# Patient Record
Sex: Female | Born: 1985 | Race: White | Hispanic: Yes | Marital: Married | Smoking: Never smoker
Health system: Southern US, Community
[De-identification: ages and names within clinical notes are randomized; demographics above are authoritative.]

## PROBLEM LIST (undated history)

## (undated) DIAGNOSIS — J45909 Unspecified asthma, uncomplicated: Secondary | ICD-10-CM

## (undated) DIAGNOSIS — G43909 Migraine, unspecified, not intractable, without status migrainosus: Secondary | ICD-10-CM

## (undated) DIAGNOSIS — T7840XA Allergy, unspecified, initial encounter: Secondary | ICD-10-CM

## (undated) HISTORY — DX: Unspecified asthma, uncomplicated: J45.909

## (undated) HISTORY — DX: Allergy, unspecified, initial encounter: T78.40XA

---

## 2004-12-28 ENCOUNTER — Inpatient Hospital Stay (HOSPITAL_COMMUNITY): Admission: AD | Admit: 2004-12-28 | Discharge: 2004-12-28 | Payer: Self-pay | Admitting: Obstetrics & Gynecology

## 2005-03-17 ENCOUNTER — Inpatient Hospital Stay (HOSPITAL_COMMUNITY): Admission: AD | Admit: 2005-03-17 | Discharge: 2005-03-17 | Payer: Self-pay | Admitting: Obstetrics & Gynecology

## 2005-03-25 ENCOUNTER — Inpatient Hospital Stay (HOSPITAL_COMMUNITY): Admission: AD | Admit: 2005-03-25 | Discharge: 2005-03-27 | Payer: Self-pay | Admitting: Obstetrics & Gynecology

## 2005-03-26 ENCOUNTER — Ambulatory Visit: Payer: Self-pay | Admitting: Pediatrics

## 2007-08-25 ENCOUNTER — Emergency Department (HOSPITAL_COMMUNITY): Admission: EM | Admit: 2007-08-25 | Discharge: 2007-08-25 | Payer: Self-pay | Admitting: Emergency Medicine

## 2009-01-14 ENCOUNTER — Emergency Department (HOSPITAL_COMMUNITY): Admission: EM | Admit: 2009-01-14 | Discharge: 2009-01-15 | Payer: Self-pay | Admitting: Emergency Medicine

## 2011-02-07 ENCOUNTER — Ambulatory Visit: Payer: Worker's Compensation

## 2011-02-16 ENCOUNTER — Ambulatory Visit: Payer: Self-pay

## 2011-02-16 DIAGNOSIS — J111 Influenza due to unidentified influenza virus with other respiratory manifestations: Secondary | ICD-10-CM

## 2012-06-17 ENCOUNTER — Ambulatory Visit: Payer: Self-pay | Admitting: Physician Assistant

## 2012-06-17 VITALS — BP 116/77 | HR 69 | Temp 98.0°F | Resp 16 | Ht 64.0 in | Wt 157.2 lb

## 2012-06-17 DIAGNOSIS — J309 Allergic rhinitis, unspecified: Secondary | ICD-10-CM

## 2012-06-17 DIAGNOSIS — R059 Cough, unspecified: Secondary | ICD-10-CM

## 2012-06-17 DIAGNOSIS — R05 Cough: Secondary | ICD-10-CM

## 2012-06-17 DIAGNOSIS — J45909 Unspecified asthma, uncomplicated: Secondary | ICD-10-CM

## 2012-06-17 MED ORDER — ALBUTEROL SULFATE HFA 108 (90 BASE) MCG/ACT IN AERS: 2.0000 | INHALATION_SPRAY | RESPIRATORY_TRACT | Status: DC | PRN

## 2012-06-17 MED ORDER — FLUTICASONE PROPIONATE 50 MCG/ACT NA SUSP: 2.0000 | Freq: Every day | NASAL | Status: DC

## 2012-06-17 MED ORDER — PREDNISONE 20 MG PO TABS: ORAL_TABLET | ORAL | Status: DC

## 2012-06-17 NOTE — Progress Notes (Signed)
  Subjective:    Patient ID: Jill Ramirez, female    DOB: 1986-02-16, 27 y.o.   MRN: [id number]  HPI This 27 y.o. female presents for evaluation of several weeks of "normal" allergties.  This past week it got much worse.  Unable to sleep.  Sore throat.  Usually worst first thing in the morning and at night.  Lots of phlegm in the throat.  Cough is non-productive.  Nasal drainage is clear, with some bloody tinge.The other night felt chest pressure associated with her symptoms.  Review of Systems As above. No HA, dizziness, vision change, N/V, diarrhea, constipation, dysuria, urinary urgency or frequency, myalgias, arthralgias or rash.     Objective:   Physical Exam Blood pressure 116/77, pulse 69, temperature 98 F (36.7 C), temperature source Oral, resp. rate 16, height 5\' 4"  (1.626 m), weight 157 lb 3.2 oz (71.305 kg), SpO2 99.00%. Body mass index is 26.97 kg/(m^2). Well-developed, well nourished female who is awake, alert and oriented, in NAD. HEENT: [place]/AT, PERRL, EOMI.  Sclera and conjunctiva are clear.  EAC are patent, TMs are normal in appearance. Nasal mucosa is pale pink and moist. OP is clear. Neck: supple, non-tender, no lymphadenopathy, thyromegaly. Heart: RRR, no murmur Lungs: normal effort, CTA Extremities: no cyanosis, clubbing or edema. Skin: warm and dry without rash. Psychologic: good mood and appropriate affect, normal speech and behavior.        Assessment & Plan:  Allergic rhinitis - Plan: fluticasone (FLONASE) 50 MCG/ACT nasal spray, predniSONE (DELTASONE) 20 MG tablet  Asthma - Plan: predniSONE (DELTASONE) 20 MG tablet, albuterol (PROVENTIL HFA;VENTOLIN HFA) 108 (90 BASE) MCG/ACT inhaler  Cough - Plan: predniSONE (DELTASONE) 20 MG tablet  Return in 1 month, sooner if worse.  Fernande Bras, PA-C Physician Assistant-Certified [facility]

## 2012-06-17 NOTE — Patient Instructions (Signed)
After spending time outdoors, bathe and change clothes to remove the allergens from your skin, hair and clothing. Drink at least 64 ounces of water each day. Once your symptoms are well controlled, you can stop the Zyrtec (or Claritin or ALlegra) and use it only as needed, but stay on the Flonase. Once you are well, f you are using the albuterol inhaler more than 3 nights/month, or more than 3 times/week, please return for additional evaluation.

## 2012-06-18 ENCOUNTER — Encounter: Payer: Self-pay | Admitting: Obstetrics & Gynecology

## 2012-06-18 ENCOUNTER — Ambulatory Visit (INDEPENDENT_AMBULATORY_CARE_PROVIDER_SITE_OTHER): Payer: Self-pay | Admitting: Obstetrics & Gynecology

## 2012-06-18 VITALS — BP 115/79 | HR 62 | Temp 98.7°F | Ht 63.0 in | Wt 158.0 lb

## 2012-06-18 DIAGNOSIS — R32 Unspecified urinary incontinence: Secondary | ICD-10-CM

## 2012-06-18 DIAGNOSIS — Z30431 Encounter for routine checking of intrauterine contraceptive device: Secondary | ICD-10-CM

## 2012-06-18 DIAGNOSIS — Z3202 Encounter for pregnancy test, result negative: Secondary | ICD-10-CM

## 2012-06-18 DIAGNOSIS — Z01419 Encounter for gynecological examination (general) (routine) without abnormal findings: Secondary | ICD-10-CM

## 2012-06-18 LAB — POCT URINALYSIS DIPSTICK
Bilirubin, UA: NEGATIVE
Glucose, UA: NEGATIVE
Ketones, UA: NEGATIVE
Nitrite, UA: NEGATIVE
Protein, UA: NEGATIVE
Spec Grav, UA: 1.02
Urobilinogen, UA: NEGATIVE
pH, UA: 5

## 2012-06-18 LAB — HCG, SERUM, QUALITATIVE: Preg, Serum: NEGATIVE

## 2012-06-18 LAB — POCT URINE PREGNANCY: Preg Test, Ur: NEGATIVE

## 2012-06-18 NOTE — Progress Notes (Signed)
.   Subjective:     Jill Ramirez is a 27 y.o. female here for a routine exam.  Current complaints: pt had her Mirena inserted 05/2006 and would like to leave it in for one more year.  Pt states that she is feels pregnant - breast tenderness, weight gain and unable to hold her urine while sneezing or laughing.  She would like a pregnancy test and her urine checked.  Personal health questionnaire reviewed: no.   Gynecologic History No LMP recorded. Patient is not currently having periods (Reason: IUD). Contraception: IUD Last Pap: 11/2010. Results were: normal Last mammogram: N/A  Obstetric History OB History   Grav Para Term Preterm Abortions TAB SAB Ect Mult Living                   The following portions of the patient's history were reviewed and updated as appropriate: allergies, current medications, past family history, past medical history, past social history, past surgical history and problem list.  Review of Systems Pertinent items are noted in HPI.    Objective:    General appearance: alert Breasts: normal appearance, no masses or tenderness Abdomen: soft, non-tender; bowel sounds normal; no masses,  no organomegaly Pelvic: cervix normal in appearance, IUD strings present; external genitalia normal, no adnexal masses or tenderness, uterus normal size, shape, and consistency and vagina normal without discharge    Assessment:    Healthy female exam.    Plan:    Return prn

## 2012-06-18 NOTE — Patient Instructions (Signed)
Calorie Counting Diet A calorie counting diet requires you to eat the number of calories that are right for you in a day. Calories are the measurement of how much energy you get from the food you eat. Eating the right amount of calories is important for staying at a healthy weight. If you eat too many calories, your body will store them as fat and you may gain weight. If you eat too few calories, you may lose weight. Counting the number of calories you eat during a day will help you know if you are eating the right amount. A Registered Dietitian can determine how many calories you need in a day. The amount of calories needed varies from person to person. If your goal is to lose weight, you will need to eat fewer calories. Losing weight can benefit you if you are overweight or have health problems such as heart disease, high blood pressure, or diabetes. If your goal is to gain weight, you will need to eat more calories. Gaining weight may be necessary if you have a certain health problem that causes your body to need more energy. TIPS Whether you are increasing or decreasing the number of calories you eat during a day, it may be hard to get used to changes in what you eat and drink. The following are tips to help you keep track of the number of calories you eat.  Measure foods at home with measuring cups. This helps you know the amount of food and number of calories you are eating.  Restaurants often serve food in amounts that are larger than 1 serving. While eating out, estimate how many servings of a food you are given. For example, a serving of cooked rice is  cup or about the size of half of a fist. Knowing serving sizes will help you be aware of how much food you are eating at restaurants.  Ask for smaller portion sizes or child-size portions at restaurants.  Plan to eat half of a meal at a restaurant. Take the rest home or share the other half with a friend.  Read the Nutrition Facts panel on  food labels for calorie content and serving size. You can find out how many servings are in a package, the size of a serving, and the number of calories each serving has.  For example, a package might contain 3 cookies. The Nutrition Facts panel on that package says that 1 serving is 1 cookie. Below that, it will say there are 3 servings in the container. The calories section of the Nutrition Facts label says there are 90 calories. This means there are 90 calories in 1 cookie (1 serving). If you eat 1 cookie you have eaten 90 calories. If you eat all 3 cookies, you have eaten 270 calories (3 servings x 90 calories = 270 calories). The list below tells you how big or small some common portion sizes are.  1 oz.........4 stacked dice.  3 oz.........Deck of cards.  1 tsp........Tip of little finger.  1 tbs........Thumb.  2 tbs........Golf ball.   cup.......Half of a fist.  1 cup........A fist. KEEP A FOOD LOG Write down every food item you eat, the amount you eat, and the number of calories in each food you eat during the day. At the end of the day, you can add up the total number of calories you have eaten. It may help to keep a list like the one below. Find out the calorie information by reading the   Nutrition Facts panel on food labels. Breakfast  Bran cereal (1 cup, 110 calories).  Fat-free milk ( cup, 45 calories). Snack  Apple (1 medium, 80 calories). Lunch  Spinach (1 cup, 20 calories).  Tomato ( medium, 20 calories).  Chicken breast strips (3 oz, 165 calories).  Shredded cheddar cheese ( cup, 110 calories).  Light Italian dressing (2 tbs, 60 calories).  Whole-wheat bread (1 slice, 80 calories).  Tub margarine (1 tsp, 35 calories).  Vegetable soup (1 cup, 160 calories). Dinner  Pork chop (3 oz, 190 calories).  Brown rice (1 cup, 215 calories).  Steamed broccoli ( cup, 20 calories).  Strawberries (1  cup, 65 calories).  Whipped cream (1 tbs, 50  calories). Daily Calorie Total: 1425 Document Released: 02/07/2005 Document Revised: 05/02/2011 Document Reviewed: 08/04/2006 Exit[employer] Patient Information 2013 [employer]. Exercise to Lose Weight Exercise and a healthy diet may help you lose weight. Your doctor may suggest specific exercises. EXERCISE IDEAS AND TIPS  Choose low-cost things you enjoy doing, such as walking, bicycling, or exercising to workout videos.  Take stairs instead of the elevator.  Walk during your lunch break.  Park your car further away from work or school.  Go to a gym or an exercise class.  Start with 5 to 10 minutes of exercise each day. Build up to 30 minutes of exercise 4 to 6 days a week.  Wear shoes with good support and comfortable clothes.  Stretch before and after working out.  Work out until you breathe harder and your heart beats faster.  Drink extra water when you exercise.  Do not do so much that you hurt yourself, feel dizzy, or get very short of breath. Exercises that burn about 150 calories:  Running 1  miles in 15 minutes.  Playing volleyball for 45 to 60 minutes.  Washing and waxing a car for 45 to 60 minutes.  Playing touch football for 45 minutes.  Walking 1  miles in 35 minutes.  Pushing a stroller 1  miles in 30 minutes.  Playing basketball for 30 minutes.  Raking leaves for 30 minutes.  Bicycling 5 miles in 30 minutes.  Walking 2 miles in 30 minutes.  Dancing for 30 minutes.  Shoveling snow for 15 minutes.  Swimming laps for 20 minutes.  Walking up stairs for 15 minutes.  Bicycling 4 miles in 15 minutes.  Gardening for 30 to 45 minutes.  Jumping rope for 15 minutes.  Washing windows or floors for 45 to 60 minutes. Document Released: 03/12/2010 Document Revised: 05/02/2011 Document Reviewed: 03/12/2010 Exit[employer] Patient Information 2013 [employer].  

## 2012-06-20 ENCOUNTER — Encounter: Payer: Self-pay | Admitting: Obstetrics & Gynecology

## 2012-06-20 LAB — PAP IG, CT-NG, RFX HPV ASCU
Chlamydia Probe Amp: NEGATIVE
GC Probe Amp: NEGATIVE

## 2012-09-26 ENCOUNTER — Encounter: Payer: Self-pay | Admitting: Family Medicine

## 2012-09-26 ENCOUNTER — Ambulatory Visit: Payer: Self-pay | Admitting: Family Medicine

## 2012-09-26 VITALS — BP 118/76 | HR 100 | Temp 98.0°F | Resp 16 | Ht 63.0 in | Wt 142.8 lb

## 2012-09-26 DIAGNOSIS — J3489 Other specified disorders of nose and nasal sinuses: Secondary | ICD-10-CM

## 2012-09-26 DIAGNOSIS — R51 Headache: Secondary | ICD-10-CM

## 2012-09-26 MED ORDER — BUTALBITAL-APAP-CAFFEINE 50-325-40 MG PO TABS: 1.0000 | ORAL_TABLET | Freq: Four times a day (QID) | ORAL | Status: DC | PRN

## 2012-09-26 MED ORDER — KETOROLAC TROMETHAMINE 30 MG/ML IJ SOLN
30.0000 mg | Freq: Once | INTRAMUSCULAR | Status: AC
  Administered 2012-09-26: 30 mg via INTRAMUSCULAR

## 2012-09-26 MED ORDER — SUMATRIPTAN SUCCINATE 50 MG PO TABS: ORAL_TABLET | ORAL | Status: DC

## 2012-09-26 NOTE — Progress Notes (Signed)
  Subjective:    Patient ID: Jill Ramirez, female    DOB: September 20, 1985, 27 y.o.   MRN: [id number]  HPI Patient presents for complaint of . Went running this morning and started having smyptoms in right side of nose. She felt like something was in her nose. Has pain in right nose and constant sneezing and rhinorrhea. Also have severe headache. Never had something similar. Does have history of headaches but never similar. Only on right side. Also bilateral eye lacrimation. Headache feels like head is hot and pressure like and sharp. Worsens with light. Improves with pressure on temples and closing eyes. Not tried taking anything. Did go to work today. Pain is 8/10. Has had nausea and vomiting today. Feels shaky. Light and sound sensitivity.     Review of Systems  Constitutional: Negative for chills and fatigue.  HENT: Positive for congestion, rhinorrhea, sneezing and neck pain.   Eyes: Positive for discharge.  Respiratory: Negative for cough and shortness of breath.   Cardiovascular: Negative for chest pain and palpitations.  Neurological: Positive for dizziness and headaches. Negative for light-headedness.  All other systems reviewed and are negative.      Objective:   Physical Exam  Constitutional: She appears well-developed and well-nourished. She appears distressed.  HENT:  Head: Normocephalic and atraumatic.  Right Ear: External ear normal.  Left Ear: External ear normal.  Nose: Nose normal.  Mouth/Throat: Oropharynx is clear and moist.  Eyes: Conjunctivae and EOM are normal. Pupils are equal, round, and reactive to light. Right eye exhibits discharge. Left eye exhibits discharge. No scleral icterus.  Neck: Neck supple.  Cardiovascular: Normal rate and regular rhythm.   Pulmonary/Chest: Effort normal. No respiratory distress.  Musculoskeletal: Normal range of motion.  Lymphadenopathy:    She has no cervical adenopathy.  Neurological: She is alert. She has normal strength. No  cranial nerve deficit. She displays a negative Romberg sign. Coordination normal.  Strenghth 5/5 in all extremities. Normal finger to nose and rapid alternative drift. Negative rhomberg and pronator drift. Patient with normal CN exam but with increased symptoms with eye tracking.  Skin: Skin is warm.  Psychiatric: She has a normal mood and affect. Her behavior is normal.      Assessment & Plan:  #1. Acute Headache with rhinorrhea and lacrimation. Features both concerning for migraine headache and new onset cluster headache. Low concern for serious intracranial pathology such as bleed, mass, etc given normal exam and overall well appearing status and normal vitals.   Will attempt trial of 100% O2 for 15 min and then recheck for response. If no improvement, cluster headache unlikely and will give IM toradol and RX for imitrex for treatment of migraine headache.  Reevaluation: Patient had minimal improvement with O2. Will give IM toradol 30 mg x 1  Rx for sumatriptan sent to pharmacy. Patient counseled on use. Given return precautions.  If persistent symptoms despite above treatments may need to consider ENT evaluation since patient has sensation of something being stuck up her nose despite negative exam.

## 2012-09-26 NOTE — Progress Notes (Signed)
History and physical exam reviewed in detail.  Agree with assessment and plan.

## 2012-09-26 NOTE — Patient Instructions (Signed)
We think you are having a type of atypical migraine headache You were given toradol in the office We will send a prescription for sumatriptan to your pharmacy.  I would recommend that you not take the phentermine until your headache improves Please return if headache worsens, you start to have weakness, strange sensations, change in speech or walking

## 2012-12-17 ENCOUNTER — Encounter (HOSPITAL_COMMUNITY): Payer: Self-pay | Admitting: Emergency Medicine

## 2012-12-17 ENCOUNTER — Emergency Department (HOSPITAL_COMMUNITY)
Admission: EM | Admit: 2012-12-17 | Discharge: 2012-12-18 | Disposition: A | Discharge status: Home / Self Care | Payer: Self-pay | Attending: Emergency Medicine | Admitting: Emergency Medicine

## 2012-12-17 DIAGNOSIS — Z3202 Encounter for pregnancy test, result negative: Secondary | ICD-10-CM | POA: Insufficient documentation

## 2012-12-17 DIAGNOSIS — R109 Unspecified abdominal pain: Secondary | ICD-10-CM

## 2012-12-17 DIAGNOSIS — IMO0002 Reserved for concepts with insufficient information to code with codable children: Secondary | ICD-10-CM | POA: Insufficient documentation

## 2012-12-17 DIAGNOSIS — Z79899 Other long term (current) drug therapy: Secondary | ICD-10-CM | POA: Insufficient documentation

## 2012-12-17 DIAGNOSIS — M7989 Other specified soft tissue disorders: Secondary | ICD-10-CM | POA: Insufficient documentation

## 2012-12-17 DIAGNOSIS — R1032 Left lower quadrant pain: Secondary | ICD-10-CM | POA: Insufficient documentation

## 2012-12-17 DIAGNOSIS — J45909 Unspecified asthma, uncomplicated: Secondary | ICD-10-CM | POA: Insufficient documentation

## 2012-12-17 NOTE — ED Notes (Signed)
Pt states today around 3 pm she started having mild abd pain  Pt states the pain has progressively gotten worse throughout the day  Pt states the pain is mainly in the left lower quadrant and radiates into the top of her thigh  Pt states she has had nausea without vomiting

## 2012-12-18 ENCOUNTER — Emergency Department (HOSPITAL_COMMUNITY): Payer: Self-pay

## 2012-12-18 ENCOUNTER — Encounter (HOSPITAL_COMMUNITY): Payer: Self-pay

## 2012-12-18 LAB — BASIC METABOLIC PANEL
BUN: 15 mg/dL (ref 6–23)
CO2: 22 mEq/L (ref 19–32)
Calcium: 9.6 mg/dL (ref 8.4–10.5)
Chloride: 102 mEq/L (ref 96–112)
Creatinine, Ser: 0.61 mg/dL (ref 0.50–1.10)
GFR calc Af Amer: >90 mL/min (ref >90)
GFR calc non Af Amer: >90 mL/min (ref >90)
Glucose, Bld: 82 mg/dL (ref 70–99)
Potassium: 3.6 mEq/L (ref 3.5–5.1)
Sodium: 136 mEq/L (ref 135–145)

## 2012-12-18 LAB — CBC
HCT: 39 % (ref 36.0–46.0)
Hemoglobin: 13.2 g/dL (ref 12.0–15.0)
MCH: 29.4 pg (ref 26.0–34.0)
MCHC: 33.8 g/dL (ref 30.0–36.0)
Platelets: 232 10*3/uL (ref 150–400)
RBC: 4.49 MIL/uL (ref 3.87–5.11)
RDW: 13.1 % (ref 11.5–15.5)
WBC: 11.5 10*3/uL — ABNORMAL HIGH (ref 4.0–10.5)

## 2012-12-18 LAB — URINALYSIS, ROUTINE W REFLEX MICROSCOPIC
Bilirubin Urine: NEGATIVE
Glucose, UA: NEGATIVE mg/dL
Nitrite: NEGATIVE
Protein, ur: NEGATIVE mg/dL
Specific Gravity, Urine: 1.018 (ref 1.005–1.030)
pH: 5 (ref 5.0–8.0)

## 2012-12-18 LAB — URINE MICROSCOPIC-ADD ON

## 2012-12-18 LAB — POCT PREGNANCY, URINE: Preg Test, Ur: NEGATIVE

## 2012-12-18 MED ORDER — ONDANSETRON HCL 4 MG/2ML IJ SOLN
4.0000 mg | Freq: Once | INTRAMUSCULAR | Status: AC
  Administered 2012-12-18: 4 mg via INTRAVENOUS
  Filled 2012-12-18: qty 2

## 2012-12-18 MED ORDER — IOHEXOL 300 MG/ML  SOLN
100.0000 mL | Freq: Once | INTRAMUSCULAR | Status: AC | PRN
  Administered 2012-12-18: 100 mL via INTRAVENOUS

## 2012-12-18 MED ORDER — ONDANSETRON 8 MG PO TBDP: 8.0000 mg | ORAL_TABLET | Freq: Three times a day (TID) | ORAL | Status: DC | PRN

## 2012-12-18 MED ORDER — HYDROCODONE-ACETAMINOPHEN 5-325 MG PO TABS: 1.0000 | ORAL_TABLET | ORAL | Status: DC | PRN

## 2012-12-18 MED ORDER — IOHEXOL 300 MG/ML  SOLN
50.0000 mL | Freq: Once | INTRAMUSCULAR | Status: AC | PRN
  Administered 2012-12-18: 50 mL via ORAL

## 2012-12-18 MED ORDER — MORPHINE SULFATE 4 MG/ML IJ SOLN
6.0000 mg | Freq: Once | INTRAMUSCULAR | Status: AC
  Administered 2012-12-18: 6 mg via INTRAVENOUS
  Filled 2012-12-18: qty 2

## 2012-12-18 MED ORDER — IBUPROFEN 600 MG PO TABS: 600.0000 mg | ORAL_TABLET | Freq: Three times a day (TID) | ORAL | Status: DC | PRN

## 2012-12-18 NOTE — ED Provider Notes (Signed)
CSN: [id number]     Arrival date & time 12/17/12  2323 History   First MD Initiated Contact with Patient 12/17/12 2345     Chief Complaint  Patient presents with  . Abdominal Pain    The history is provided by the patient.   patient presents with developing acute left lower quadrant abdominal pain which began this morning.  She denies vaginal bleeding.  No dysuria urinary frequency.  No radiation to her left flank.  No history kidney stones.  She is a G1 P28 with a 63-year-old daughter.  No prior history of ectopic pregnancy.  No vaginal complaints.  She states she has a normal bowel movement earlier today.  No history of ovarian cyst.  Her pain is mild to moderate in severity.  Nothing worsens or improves her pain.  Her pain is intermittent.  There is some radiation of her pain down towards her left anterior thigh.  The left leg swelling.  Past Medical History  Diagnosis Date  . Allergy   . Asthma    History reviewed. No pertinent past surgical history. Family History  Problem Relation Age of Onset  . Asthma Daughter   . Heart disease Maternal Grandmother    History  Substance Use Topics  . Smoking status: Never Smoker   . Smokeless tobacco: Never Used  . Alcohol Use: 1.8 oz/week    3 Cans of beer per week   OB History   Grav Para Term Preterm Abortions TAB SAB Ect Mult Living                 Review of Systems  Gastrointestinal: Positive for abdominal pain.  All other systems reviewed and are negative.    Allergies  Vicodin  Home Medications   Current Outpatient Rx  Name  Route  Sig  Dispense  Refill  . albuterol (PROVENTIL HFA;VENTOLIN HFA) 108 (90 BASE) MCG/ACT inhaler   Inhalation   Inhale 2 puffs into the lungs every 4 (four) hours as needed for wheezing (cough, shortness of breath or wheezing.).   1 Inhaler   1   . butalbital-acetaminophen-caffeine (FIORICET) 50-325-40 MG per tablet   Oral   Take 1-2 tablets by mouth every 6 (six) hours as needed for  headache.   20 tablet   0   . fluticasone (FLONASE) 50 MCG/ACT nasal spray   Nasal   Place 2 sprays into the nose daily.   16 g   12   . naproxen sodium (ANAPROX) 220 MG tablet   Oral   Take 220 mg by mouth 2 (two) times daily with a meal.         . phentermine 37.5 MG capsule   Oral   Take 37.5 mg by mouth every morning.         Marland Kitchen HYDROcodone-acetaminophen (NORCO/VICODIN) 5-325 MG per tablet   Oral   Take 1 tablet by mouth every 4 (four) hours as needed for pain.   15 tablet   0   . ondansetron (ZOFRAN ODT) 8 MG disintegrating tablet   Oral   Take 1 tablet (8 mg total) by mouth every 8 (eight) hours as needed for nausea.   10 tablet   0    BP 115/61  Pulse 81  Temp(Src) 98.4 F (36.9 C) (Oral)  Resp 18  SpO2 99% Physical Exam  Nursing note and vitals reviewed. Constitutional: She is oriented to person, place, and time. She appears well-developed and well-nourished. No distress.  HENT:  Head:  Normocephalic and atraumatic.  Eyes: EOM are normal.  Neck: Normal range of motion.  Cardiovascular: Normal rate, regular rhythm and normal heart sounds.   Pulmonary/Chest: Effort normal and breath sounds normal.  Abdominal: Soft. She exhibits no distension.  Mild tenderness LLQ without guarding or rebound  Musculoskeletal: Normal range of motion.  Neurological: She is alert and oriented to person, place, and time.  Skin: Skin is warm and dry.  Psychiatric: She has a normal mood and affect. Judgment normal.    ED Course  Procedures (including critical care time) Labs Review Labs Reviewed  URINALYSIS, ROUTINE W REFLEX MICROSCOPIC - Abnormal; Notable for the following:    Hgb urine dipstick TRACE (*)    Leukocytes, UA SMALL (*)    All other components within normal limits  CBC - Abnormal; Notable for the following:    WBC 11.5 (*)    All other components within normal limits  URINE MICROSCOPIC-ADD ON - Abnormal; Notable for the following:    Squamous Epithelial  / LPF FEW (*)    Bacteria, UA FEW (*)    All other components within normal limits  BASIC METABOLIC PANEL  POCT PREGNANCY, URINE   Imaging Review US Transvaginal Non-ob  12/18/2012   CLINICAL DATA:  Left lower pelvic pain. IUD.  EXAM: TRANSABDOMINAL AND TRANSVAGINAL ULTRASOUND OF PELVIS  DOPPLER ULTRASOUND OF OVARIES  TECHNIQUE: Both transabdominal and transvaginal ultrasound examinations of the pelvis were performed. Transabdominal technique was performed for global imaging of the pelvis including uterus, ovaries, adnexal regions, and pelvic cul-de-sac.  It was necessary to proceed with endovaginal exam following the transabdominal exam to visualize the uterus and ovaries. Color and duplex Doppler ultrasound was utilized to evaluate blood flow to the ovaries.  COMPARISON:  12/28/2004  FINDINGS: Uterus  Measurements: 3 x 4.5 x 6.4 cm, retroflexed. No fibroids or other mass visualized.  Endometrium  Thickness: Obscured by shadowing from IUD. No focal abnormality visualized.  Right ovary  Measurements: 2 x 2.7 x 3.5 cm, containing a collapsed 1 x 1.4 cm cyst. Normal appearance/no adnexal mass.  Left ovary  Measurements: 14 x 19 x 28 mm. Normal appearance/no adnexal mass.  Pulsed Doppler evaluation of both ovaries demonstrates normal low-resistance arterial and venous waveforms.  Other findings  There is a moderate amount of free pelvic fluid.  IMPRESSION: 1. Unremarkable ovaries. No evidence of torsion. 2. Moderate free pelvic fluid.   Electronically Signed   By: Oley Balm M.D.   On: 12/18/2012 01:55   US Pelvis Complete  12/18/2012   CLINICAL DATA:  Left lower pelvic pain. IUD.  EXAM: TRANSABDOMINAL AND TRANSVAGINAL ULTRASOUND OF PELVIS  DOPPLER ULTRASOUND OF OVARIES  TECHNIQUE: Both transabdominal and transvaginal ultrasound examinations of the pelvis were performed. Transabdominal technique was performed for global imaging of the pelvis including uterus, ovaries, adnexal regions, and pelvic  cul-de-sac.  It was necessary to proceed with endovaginal exam following the transabdominal exam to visualize the uterus and ovaries. Color and duplex Doppler ultrasound was utilized to evaluate blood flow to the ovaries.  COMPARISON:  12/28/2004  FINDINGS: Uterus  Measurements: 3 x 4.5 x 6.4 cm, retroflexed. No fibroids or other mass visualized.  Endometrium  Thickness: Obscured by shadowing from IUD. No focal abnormality visualized.  Right ovary  Measurements: 2 x 2.7 x 3.5 cm, containing a collapsed 1 x 1.4 cm cyst. Normal appearance/no adnexal mass.  Left ovary  Measurements: 14 x 19 x 28 mm. Normal appearance/no adnexal mass.  Pulsed Doppler evaluation  of both ovaries demonstrates normal low-resistance arterial and venous waveforms.  Other findings  There is a moderate amount of free pelvic fluid.  IMPRESSION: 1. Unremarkable ovaries. No evidence of torsion. 2. Moderate free pelvic fluid.   Electronically Signed   By: Oley Balm M.D.   On: 12/18/2012 01:55   Ct Abdomen Pelvis W Contrast  12/18/2012   CLINICAL DATA:  Left lower quadrant abdominal pain and nausea  EXAM: CT ABDOMEN AND PELVIS WITH CONTRAST  TECHNIQUE: Multidetector CT imaging of the abdomen and pelvis was performed using the standard protocol following bolus administration of intravenous contrast.  CONTRAST:  OMNIPAQUE IOHEXOL 300 MG/ML  SOLN  COMPARISON:  None.  FINDINGS: BODY WALL: Unremarkable.  LOWER CHEST: Unremarkable.  ABDOMEN/PELVIS:  Liver: No focal abnormality.  Biliary: No evidence of biliary obstruction or stone.  Pancreas: Unremarkable.  Spleen: Unremarkable.  Adrenals: Unremarkable.  Kidneys and ureters: No hydronephrosis or stone.  Bladder: Unremarkable.  Reproductive: There is a right-sided corpus luteum. IUD present, normally seated within the upper uterus.  Bowel: No obstruction. Normal appendix.  Retroperitoneum: No mass or adenopathy.  Peritoneum: Moderate free pelvic fluid which is simple in density.  Vascular:  No acute abnormality.  OSSEOUS: No acute abnormalities.  IMPRESSION: 1. No evidence of acute intra-abdominal disease. 2. Moderate, simple free pelvic fluid.   Electronically Signed   By: Tiburcio Pea M.D.   On: 12/18/2012 04:51   [place] Art/ven Flow Abd Pelv Doppler  12/18/2012   CLINICAL DATA:  Left lower pelvic pain. IUD.  EXAM: TRANSABDOMINAL AND TRANSVAGINAL ULTRASOUND OF PELVIS  DOPPLER ULTRASOUND OF OVARIES  TECHNIQUE: Both transabdominal and transvaginal ultrasound examinations of the pelvis were performed. Transabdominal technique was performed for global imaging of the pelvis including uterus, ovaries, adnexal regions, and pelvic cul-de-sac.  It was necessary to proceed with endovaginal exam following the transabdominal exam to visualize the uterus and ovaries. Color and duplex Doppler ultrasound was utilized to evaluate blood flow to the ovaries.  COMPARISON:  12/28/2004  FINDINGS: Uterus  Measurements: 3 x 4.5 x 6.4 cm, retroflexed. No fibroids or other mass visualized.  Endometrium  Thickness: Obscured by shadowing from IUD. No focal abnormality visualized.  Right ovary  Measurements: 2 x 2.7 x 3.5 cm, containing a collapsed 1 x 1.4 cm cyst. Normal appearance/no adnexal mass.  Left ovary  Measurements: 14 x 19 x 28 mm. Normal appearance/no adnexal mass.  Pulsed Doppler evaluation of both ovaries demonstrates normal low-resistance arterial and venous waveforms.  Other findings  There is a moderate amount of free pelvic fluid.  IMPRESSION: 1. Unremarkable ovaries. No evidence of torsion. 2. Moderate free pelvic fluid.   Electronically Signed   By: Oley Balm M.D.   On: 12/18/2012 01:55  I personally reviewed the imaging tests through PACS system I reviewed available ER/hospitalization records through the EMR   EKG Interpretation   None       MDM   1. Abdominal pain    5:04 AM Patient feels much better this time.  Discharge home in good condition    Lyanne Co, MD 12/18/12  [phone]

## 2012-12-27 ENCOUNTER — Other Ambulatory Visit: Payer: Self-pay

## 2013-01-21 ENCOUNTER — Ambulatory Visit: Payer: Self-pay | Admitting: Obstetrics & Gynecology

## 2013-04-10 ENCOUNTER — Ambulatory Visit: Payer: Self-pay | Admitting: Family Medicine

## 2013-04-10 VITALS — BP 110/70 | HR 110 | Temp 98.2°F | Resp 16 | Ht 63.0 in | Wt 137.0 lb

## 2013-04-10 DIAGNOSIS — R197 Diarrhea, unspecified: Secondary | ICD-10-CM

## 2013-04-10 DIAGNOSIS — L03314 Cellulitis of groin: Secondary | ICD-10-CM

## 2013-04-10 DIAGNOSIS — R109 Unspecified abdominal pain: Secondary | ICD-10-CM

## 2013-04-10 DIAGNOSIS — R112 Nausea with vomiting, unspecified: Secondary | ICD-10-CM

## 2013-04-10 LAB — POCT CBC
Granulocyte percent: 73.5 %G (ref 37–80)
HCT, POC: 40.1 % (ref 37.7–47.9)
HEMOGLOBIN: 12.6 g/dL (ref 12.2–16.2)
Lymph, poc: 1.8 (ref 0.6–3.4)
MCH, POC: 29.4 pg (ref 27–31.2)
MCHC: 31.4 g/dL — AB (ref 31.8–35.4)
MCV: 93.8 fL (ref 80–97)
MID (CBC): 0.7 (ref 0–0.9)
MPV: 8.6 fL (ref 0–99.8)
PLATELET COUNT, POC: 263 10*3/uL (ref 142–424)
POC GRANULOCYTE: 6.9 (ref 2–6.9)
POC LYMPH PERCENT: 19.1 %L (ref 10–50)
POC MID %: 7.4 %M (ref 0–12)
RBC: 4.28 M/uL (ref 4.04–5.48)
RDW, POC: 13.8 %
WBC: 9.4 10*3/uL (ref 4.6–10.2)

## 2013-04-10 LAB — POCT URINALYSIS DIPSTICK
Bilirubin, UA: NEGATIVE
Glucose, UA: NEGATIVE
Ketones, UA: NEGATIVE
Leukocytes, UA: NEGATIVE
Nitrite, UA: NEGATIVE
Protein, UA: NEGATIVE
Spec Grav, UA: 1.015
Urobilinogen, UA: 0.2
pH, UA: 7

## 2013-04-10 LAB — POCT WET PREP WITH KOH
Clue Cells Wet Prep HPF POC: NEGATIVE
KOH PREP POC: NEGATIVE
TRICHOMONAS UA: NEGATIVE
YEAST WET PREP PER HPF POC: NEGATIVE

## 2013-04-10 LAB — POCT UA - MICROSCOPIC ONLY
Casts, Ur, LPF, POC: NEGATIVE
Crystals, Ur, HPF, POC: NEGATIVE
Mucus, UA: NEGATIVE
Yeast, UA: NEGATIVE

## 2013-04-10 LAB — POCT URINE PREGNANCY: Preg Test, Ur: NEGATIVE

## 2013-04-10 MED ORDER — DOXYCYCLINE HYCLATE 100 MG PO TABS: 100.0000 mg | ORAL_TABLET | Freq: Two times a day (BID) | ORAL | Status: DC

## 2013-04-10 NOTE — Progress Notes (Signed)
Physical Exam  Genitourinary: Vagina normal and uterus normal. Cervix exhibits discharge (thin, white). Cervix exhibits no motion tenderness and no friability. Right adnexum displays no mass, no tenderness and no fullness. Left adnexum displays tenderness. Left adnexum displays no mass and no fullness.  Noted area has a 3 cm erythematous, indurated area. No central fluctuance or drainage noted. No vesicles or blisters..Marland Kitchen

## 2013-04-10 NOTE — Patient Instructions (Signed)
You should receive a call or letter about your lab results within the next week to 10 days.  Start antibiotic and warm compresses to swollen bump in groin.  If not improving in next few days, can return for recheck.  Try over the counter prilosec once per day.  Restart diet slowly - bland foods for now.  If abdominal pain not improving in next week - return for recheck.  Return to the clinic or go to the nearest emergency room if any of your symptoms worsen or new symptoms occur. Abdominal Pain, Adult Many things can cause abdominal pain. Usually, abdominal pain is not caused by a disease and will improve without treatment. It can often be observed and treated at home. Your health care provider will do a physical exam and possibly order blood tests and X-rays to help determine the seriousness of your pain. However, in many cases, more time must pass before a clear cause of the pain can be found. Before that point, your health care provider may not know if you need more testing or further treatment. HOME CARE INSTRUCTIONS  Monitor your abdominal pain for any changes. The following actions may help to alleviate any discomfort you are experiencing:  Only take over-the-counter or prescription medicines as directed by your health care provider.  Do not take laxatives unless directed to do so by your health care provider.  Try a clear liquid diet (broth, tea, or water) as directed by your health care provider. Slowly move to a bland diet as tolerated. SEEK MEDICAL CARE IF:  You have unexplained abdominal pain.  You have abdominal pain associated with nausea or diarrhea.  You have pain when you urinate or have a bowel movement.  You experience abdominal pain that wakes you in the night.  You have abdominal pain that is worsened or improved by eating food.  You have abdominal pain that is worsened with eating fatty foods. SEEK IMMEDIATE MEDICAL CARE IF:   Your pain does not go away within 2  hours.  You have a fever.  You keep throwing up (vomiting).  Your pain is felt only in portions of the abdomen, such as the right side or the left lower portion of the abdomen.  You pass bloody or black tarry stools. MAKE SURE YOU:  Understand these instructions.   Will watch your condition.   Will get help right away if you are not doing well or get worse.  Document Released: 11/17/2004 Document Revised: 11/28/2012 Document Reviewed: 10/17/2012 [facility] Patient Information 2014 [place], [place].

## 2013-04-10 NOTE — Progress Notes (Addendum)
Subjective:   This chart was scribed for Jill Staggers, MD by Arlan Organ, [facility] Scribe. This patient was seen in room  and the patient's care was started 6:26 PM.   Authored by Silas Sacramento, MD, but unable to change in [facility].    Patient ID: Jill Ramirez, female    DOB: 05-17-1985, 28 y.o.   MRN: [id number]  HPI  HPI Comments: Jill Ramirez is a 28 y.o. female who presents to [facility] complaining of gradual onset, unchanged abdominal pain with associated nausea and diarrhea that initially started 3 days ago. Pt states her emesis, nausea, and diarrhea has now resolved, but she is still experiencing upper quadrant abdominal pain brought on by eating. She has noticed a painful rash to her labia onset yesterday that she says is new for her. She also reports new fatigue and dysuria. She has not tried anything OTC for her current symptoms. Denies any alleviating or aggravating factors. She denies any vaginal discharge or chills at this time. Denies any new partners. Denies any PMHx of STI's, and states she has been tested in the past. Pt states she was taking Phentermine consistently for weight loss, but says she stopped taking it about 1 month ago. Pt had a pelvic ultrasound and an abdominal CT 11/2012 without any acute findings. Pt has a PMHx of asthma.  PCP: No PCP Per Patient   Patient Active Problem List   Diagnosis Date Noted   Routine gynecological examination 06/18/2012   Surveillance of previously prescribed intrauterine contraceptive device 06/18/2012   Past Medical History  Diagnosis Date   Allergy    Asthma    History reviewed. No pertinent past surgical history. Allergies  Allergen Reactions   Vicodin [Hydrocodone-Acetaminophen] Swelling   Prior to Admission medications   Medication Sig Start Date End Date Taking? Authorizing Provider  butalbital-acetaminophen-caffeine (FIORICET) 50-325-40 MG per tablet Take 1-2  tablets by mouth every 6 (six) hours as needed for headache. 09/26/12 09/26/13 Yes Daine Gip, MD  fluticasone (FLONASE) 50 MCG/ACT nasal spray Place 2 sprays into the nose daily. 06/17/12  Yes Chelle S Jeffery, PA-C  phentermine 37.5 MG capsule Take 37.5 mg by mouth every morning.   Yes Historical Provider, MD  albuterol (PROVENTIL HFA;VENTOLIN HFA) 108 (90 BASE) MCG/ACT inhaler Inhale 2 puffs into the lungs every 4 (four) hours as needed for wheezing (cough, shortness of breath or wheezing.). 06/17/12   Chelle S Jeffery, PA-C  naproxen sodium (ANAPROX) 220 MG tablet Take 220 mg by mouth 2 (two) times daily with a meal.    Historical Provider, MD  ondansetron (ZOFRAN ODT) 8 MG disintegrating tablet Take 1 tablet (8 mg total) by mouth every 8 (eight) hours as needed for nausea. 12/18/12   Lyanne Co, MD   History   Social History   Marital Status: Married    Spouse Name: Evelena Peat    Number of Children: 1   Years of Education: 12+   Occupational History   Event Planning    Social History Main Topics   Smoking status: Never Smoker    Smokeless tobacco: Never Used   Alcohol Use: 1.8 oz/week    3 Cans of beer per week   Drug Use: No   Sexual Activity: Yes    Partners: Male    Pharmacist, hospital Protection: Implant   Other Topics Concern   Not on file   Social History Narrative   From [place]  Came to the [place] in [place].  Lives with her husband and their daughter.     Review of Systems  Constitutional: Negative for chills.  Gastrointestinal: Positive for nausea, vomiting, abdominal pain and diarrhea.  Genitourinary: Negative for vaginal discharge.    Filed Vitals:   04/10/13 1659  BP: 110/70  Pulse: 110  Temp: 98.2 F (36.8 C)  TempSrc: Oral  Resp: 16  Height: 5\' 3"  (1.6 m)  Weight: 137 lb (62.143 kg)  SpO2: 99%    Objective:  Physical Exam  Nursing note and vitals reviewed. Constitutional: She is oriented to person, place, and time. She appears  well-developed and well-nourished.  HENT:  Head: Normocephalic and atraumatic.  Eyes: EOM are normal.  Neck: Normal range of motion.  Cardiovascular: Normal rate, regular rhythm and normal heart sounds.  Exam reveals no gallop and no friction rub.   No murmur heard. Pulmonary/Chest: Effort normal and breath sounds normal.  Abdominal: Bowel sounds are normal. She exhibits no distension. There is tenderness.  Diffuse tenderness most noticeable over epigastric region Negative Murphy's sign Minimal tenderness at [facility]  Musculoskeletal: Normal range of motion.  Neurological: She is alert and oriented to person, place, and time.  Skin: Skin is warm and dry.  Psychiatric: She has a normal mood and affect. Her behavior is normal.     Results for orders placed in visit on 04/10/13  POCT UA - MICROSCOPIC ONLY      Result Value Ref Range   WBC, Ur, HPF, POC 0-1     RBC, urine, microscopic 2-4     Bacteria, U Microscopic trace     Mucus, UA neg     Epithelial cells, urine per micros 0-1     Crystals, Ur, HPF, POC neg     Casts, Ur, LPF, POC neg     Yeast, UA neg    POCT URINALYSIS DIPSTICK      Result Value Ref Range   Color, UA yellow     Clarity, UA clear     Glucose, UA neg     Bilirubin, UA neg     Ketones, UA neg     Spec Grav, UA 1.015     Blood, UA small     pH, UA 7.0     Protein, UA neg     Urobilinogen, UA 0.2     Nitrite, UA neg     Leukocytes, UA Negative    POCT URINE PREGNANCY      Result Value Ref Range   Preg Test, Ur Negative    POCT CBC      Result Value Ref Range   WBC 9.4  4.6 - 10.2 K/uL   Lymph, poc 1.8  0.6 - 3.4   POC LYMPH PERCENT 19.1  10 - 50 %L   MID (cbc) 0.7  0 - 0.9   POC MID % 7.4  0 - 12 %M   POC Granulocyte 6.9  2 - 6.9   Granulocyte percent 73.5  37 - 80 %G   RBC 4.28  4.04 - 5.48 M/uL   Hemoglobin 12.6  12.2 - 16.2 g/dL   HCT, POC 62.140.1  30.837.7 - 47.9 %   MCV 93.8  80 - 97 fL   MCH, POC 29.4  27 - 31.2 pg   MCHC 31.4 (*)  31.8 - 35.4 g/dL   RDW, POC 65.713.8     Platelet Count, POC 263  142 - 424 K/uL   MPV 8.6  0 - 99.8 fL  POCT WET PREP WITH KOH      Result Value Ref Range   Trichomonas, UA Negative     Clue Cells Wet Prep HPF POC neg     Epithelial Wet Prep HPF POC 2-5     Yeast Wet Prep HPF POC neg     Bacteria Wet Prep HPF POC 1+     RBC Wet Prep HPF POC 0-2     WBC Wet Prep HPF POC 3-5     KOH Prep POC Negative         Assessment & Plan:   Jill Ramirez is a 28 y.o. female Abdominal pain - Plan: POCT UA - Microscopic Only, POCT urinalysis dipstick, POCT urine pregnancy, POCT CBC, Comprehensive metabolic panel, POCT Wet Prep with KOH, Nausea with vomiting - Plan: POCT CBC, Comprehensive metabolic panel, Diarrhea - Plan: POCT CBC, Comprehensive metabolic panel  -possible viral illness, improving. Labs pending, rtc precautions. Slow return of normal diet.   Cellulitis of groin - Plan: doxycycline (VIBRA-TABS) 100 MG tablet - early groin abscess/cellulitis - see note by Rhoderick Moody, PA-C above. Warm soaks, sx care and recheck if increase in swelling next few days.    Meds ordered this encounter  Medications   doxycycline (VIBRA-TABS) 100 MG tablet    Sig: Take 1 tablet (100 mg total) by mouth 2 (two) times daily.    Dispense:  20 tablet    Refill:  0   Patient Instructions  You should receive a call or letter about your lab results within the next week to 10 days.  Start antibiotic and warm compresses to swollen bump in groin.  If not improving in next few days, can return for recheck.  Try over the counter prilosec once per day.  Restart diet slowly - bland foods for now.  If abdominal pain not improving in next week - return for recheck.  Return to the clinic or go to the nearest emergency room if any of your symptoms worsen or new symptoms occur. Abdominal Pain, Adult Many things can cause abdominal pain. Usually, abdominal pain is not caused by a disease and will improve without  treatment. It can often be observed and treated at home. Your health care provider will do a physical exam and possibly order blood tests and X-rays to help determine the seriousness of your pain. However, in many cases, more time must pass before a clear cause of the pain can be found. Before that point, your health care provider may not know if you need more testing or further treatment. HOME CARE INSTRUCTIONS  Monitor your abdominal pain for any changes. The following actions may help to alleviate any discomfort you are experiencing:  Only take over-the-counter or prescription medicines as directed by your health care provider.  Do not take laxatives unless directed to do so by your health care provider.  Try a clear liquid diet (broth, tea, or water) as directed by your health care provider. Slowly move to a bland diet as tolerated. SEEK MEDICAL CARE IF:  You have unexplained abdominal pain.  You have abdominal pain associated with nausea or diarrhea.  You have pain when you urinate or have a bowel movement.  You experience abdominal pain that wakes you in the night.  You have abdominal pain that is worsened or improved by eating food.  You have abdominal pain that is worsened with eating fatty foods. SEEK IMMEDIATE MEDICAL CARE IF:   Your pain does not  go away within 2 hours.  You have a fever.  You keep throwing up (vomiting).  Your pain is felt only in portions of the abdomen, such as the right side or the left lower portion of the abdomen.  You pass bloody or black tarry stools. MAKE SURE YOU:  Understand these instructions.   Will watch your condition.   Will get help right away if you are not doing well or get worse.  Document Released: 11/17/2004 Document Revised: 11/28/2012 Document Reviewed: 10/17/2012 [facility] Patient Information 2014 [place], [place].

## 2013-04-11 LAB — COMPREHENSIVE METABOLIC PANEL
ALK PHOS: 58 U/L (ref 39–117)
ALT: 12 U/L (ref 0–35)
AST: 18 U/L (ref 0–37)
Albumin: 5 g/dL (ref 3.5–5.2)
BILIRUBIN TOTAL: 0.4 mg/dL (ref 0.2–1.2)
BUN: 11 mg/dL (ref 6–23)
CO2: 25 meq/L (ref 19–32)
CREATININE: 0.69 mg/dL (ref 0.50–1.10)
Calcium: 9.8 mg/dL (ref 8.4–10.5)
Chloride: 100 mEq/L (ref 96–112)
Glucose, Bld: 89 mg/dL (ref 70–99)
Potassium: 4.3 mEq/L (ref 3.5–5.3)
SODIUM: 137 meq/L (ref 135–145)
TOTAL PROTEIN: 7.6 g/dL (ref 6.0–8.3)

## 2013-06-19 ENCOUNTER — Ambulatory Visit: Payer: Self-pay | Admitting: Obstetrics & Gynecology

## 2013-08-28 ENCOUNTER — Encounter: Payer: Self-pay | Admitting: Obstetrics & Gynecology

## 2013-08-28 ENCOUNTER — Ambulatory Visit (INDEPENDENT_AMBULATORY_CARE_PROVIDER_SITE_OTHER): Payer: Self-pay | Admitting: Obstetrics & Gynecology

## 2013-08-28 VITALS — BP 116/77 | HR 69 | Temp 97.4°F | Ht 63.0 in | Wt 141.0 lb

## 2013-08-28 DIAGNOSIS — Z01419 Encounter for gynecological examination (general) (routine) without abnormal findings: Secondary | ICD-10-CM

## 2013-08-28 DIAGNOSIS — Z113 Encounter for screening for infections with a predominantly sexual mode of transmission: Secondary | ICD-10-CM

## 2013-08-28 LAB — HEMOGLOBIN AND HEMATOCRIT, BLOOD
HEMATOCRIT: 38 % (ref 36.0–46.0)
HEMOGLOBIN: 12.7 g/dL (ref 12.0–15.0)

## 2013-08-28 NOTE — Patient Instructions (Signed)
Pelvic Pain, Female Female pelvic pain can be caused by many different things and start from a variety of places. Pelvic pain refers to pain that is located in the lower half of the abdomen and between your hips. The pain may occur over a short period of time (acute) or may be reoccurring (chronic). The cause of pelvic pain may be related to disorders affecting the female reproductive organs (gynecologic), but it may also be related to the bladder, kidney stones, an intestinal complication, or muscle or skeletal problems. Getting help right away for pelvic pain is important, especially if there has been severe, sharp, or a sudden onset of unusual pain. It is also important to get help right away because some types of pelvic pain can be life threatening.  CAUSES  Below are only some of the causes of pelvic pain. The causes of pelvic pain can be in one of several categories.   Gynecologic.  Pelvic inflammatory disease.  Sexually transmitted infection.  Ovarian cyst or a twisted ovarian ligament (ovarian torsion).  Uterine lining that grows outside the uterus (endometriosis).  Fibroids, cysts, or tumors.  Ovulation.  Pregnancy.  Pregnancy that occurs outside the uterus (ectopic pregnancy).  Miscarriage.  Labor.  Abruption of the placenta or ruptured uterus.  Infection.  Uterine infection (endometritis).  Bladder infection.  Diverticulitis.  Miscarriage related to a uterine infection (septic abortion).  Bladder.  Inflammation of the bladder (cystitis).  Kidney stone(s).  Gastrointenstinal.  Constipation.  Diverticulitis.  Neurologic.  Trauma.  Feeling pelvic pain because of mental or emotional causes (psychosomatic).  Cancers of the bowel or pelvis. EVALUATION  Your caregiver will want to take a careful history of your concerns. This includes recent changes in your health, a careful gynecologic history of your periods (menses), and a sexual history. Obtaining  your family history and medical history is also important. Your caregiver may suggest a pelvic exam. A pelvic exam will help identify the location and severity of the pain. It also helps in the evaluation of which organ system may be involved. In order to identify the cause of the pelvic pain and be properly treated, your caregiver may order tests. These tests may include:   A pregnancy test.  Pelvic ultrasonography.  An X-ray exam of the abdomen.  A urinalysis or evaluation of vaginal discharge.  Blood tests. HOME CARE INSTRUCTIONS   Only take over-the-counter or prescription medicines for pain, discomfort, or fever as directed by your caregiver.   Rest as directed by your caregiver.   Eat a balanced diet.   Drink enough fluids to make your urine clear or pale yellow, or as directed.   Avoid sexual intercourse if it causes pain.   Apply warm or cold compresses to the lower abdomen depending on which one helps the pain.   Avoid stressful situations.   Keep a journal of your pelvic pain. Write down when it started, where the pain is located, and if there are things that seem to be associated with the pain, such as food or your menstrual cycle.  Follow up with your caregiver as directed.  SEEK MEDICAL CARE IF:  Your medicine does not help your pain.  You have abnormal vaginal discharge. SEEK IMMEDIATE MEDICAL CARE IF:   You have heavy bleeding from the vagina.   Your pelvic pain increases.   You feel lightheaded or faint.   You have chills.   You have pain with urination or blood in your urine.   You have uncontrolled   diarrhea or vomiting.   You have a fever or persistent symptoms for more than 3 days.  You have a fever and your symptoms suddenly get worse.   You are being physically or sexually abused.  MAKE SURE YOU:  Understand these instructions.  Will watch your condition.  Will get help if you are not doing well or get worse. Document  Released: 01/05/2004 Document Revised: 08/09/2011 Document Reviewed: 05/30/2011 Exit[employer] Patient Information 2015 [employer]. This information is not intended to replace advice given to you by your health care provider. Make sure you discuss any questions you have with your health care provider.  

## 2013-08-28 NOTE — Progress Notes (Signed)
Subjective:     Jill Ramirez is a 28 y.o. female here for a routine exam.  Current complaints: stress, episodic lower abdominal/pelvic pain.    Personal health questionnaire:  Is patient Ashkenazi Jewish, have a family history of breast and/or ovarian cancer: no Is there a family history of uterine cancer diagnosed at age < 3450, gastrointestinal cancer, urinary tract cancer, family member who is a Personnel officerLynch syndrome-associated carrier: no Is the patient overweight and hypertensive, family history of diabetes, personal history of gestational diabetes or PCOS: no Is patient over 3955, have PCOS,  family history of premature CHD under age 29, diabetes, smoke, have hypertension or peripheral artery disease:  no   Gynecologic History No LMP recorded. Patient is not currently having periods (Reason: IUD). Contraception: IUD Last Pap: 2014. Results were: normal   Obstetric History OB History  Gravida Para Term Preterm AB SAB TAB Ectopic Multiple Living  1 1 1       1     # Outcome Date GA Lbr Len/2nd Weight Sex Delivery Anes PTL Lv  1 TRM 03/25/05 [id number]  3.175 kg (7 lb) F SVD None  Y      Past Medical History  Diagnosis Date  . Allergy   . Asthma     History reviewed. No pertinent past surgical history.  Current outpatient prescriptions:albuterol (PROVENTIL HFA;VENTOLIN HFA) 108 (90 BASE) MCG/ACT inhaler, Inhale 2 puffs into the lungs every 4 (four) hours as needed for wheezing (cough, shortness of breath or wheezing.)., Disp: 1 Inhaler, Rfl: 1;  phentermine 37.5 MG capsule, Take 37.5 mg by mouth every morning., Disp: , Rfl:  Allergies  Allergen Reactions  . Vicodin [Hydrocodone-Acetaminophen] Swelling    History  Substance Use Topics  . Smoking status: Never Smoker   . Smokeless tobacco: Never Used  . Alcohol Use: 1.8 oz/week    3 Cans of beer per week    Family History  Problem Relation Age of Onset  . Asthma Daughter   . Heart disease Maternal Grandmother       Review of  Systems  Constitutional: negative for fatigue and weight loss Respiratory: negative for cough and wheezing Cardiovascular: negative for chest pain, fatigue and palpitations Gastrointestinal: negative for abdominal pain and change in bowel habits Musculoskeletal:negative for myalgias Neurological: negative for gait problems and tremors Behavioral/Psych: negative for abusive relationship, depression; positive for stress Endocrine: negative for temperature intolerance   Genitourinary:negative for abnormal menstrual periods, genital lesions, hot flashes, sexual problems and vaginal discharge; positive for pelvic pain Integument/breast: negative for breast lump, nipple discharge and skin lesion(s); positive for breast tenderness    Objective:       BP 116/77  Pulse 69  Temp(Src) 97.4 F (36.3 C)  Ht 5\' 3"  (1.6 m)  Wt 63.957 kg (141 lb)  BMI 24.98 kg/m2 General:   alert  Skin:   no rash or abnormalities  Lungs:   clear to auscultation bilaterally  Heart:   regular rate and rhythm, S1, S2 normal, no murmur, click, rub or gallop  Breasts:   normal without suspicious masses, skin or nipple changes or axillary nodes; mild left-sided tenderness  Abdomen:  normal findings: no organomegaly, soft, non-tender and no hernia  Pelvis:  External genitalia: normal general appearance Urinary system: urethral meatus normal and bladder without fullness, nontender Vaginal: normal, no masses; levator muscles tender, "violin string-like"; no uterosacral ligament nodularity Cervix: normal appearance Adnexa: mild bilateral tenderness, no masses Uterus: anteverted and non-tender, normal size   Lab Review  Urine pregnancy test Labs reviewed yes Radiologic studies reviewed yes Pelvic pain questionnaire reviewed Depression screen negative  Assessment:    Chronic pain--DDx chronic bladder pain syndrome, myofascial pain  Plan:    Education reviewed: low fat, low cholesterol diet, safe sex/STD  prevention and weight bearing exercise.  Orders Placed This Encounter  Procedures  . WET PREP BY MOLECULAR PROBE  . GC/Chlamydia Probe Amp  . Urine culture  . Urinalysis, Routine w reflex microscopic  . HIV antibody  . RPR  . Hemoglobin and hematocrit, blood  Keep a voiding diary Need to obtain previous records Possible management options include: PT referral Return for IUD removal/insertion

## 2013-08-29 LAB — HIV ANTIBODY (ROUTINE TESTING W REFLEX): HIV: NONREACTIVE

## 2013-08-29 LAB — URINE CULTURE: Colony Count: 50000

## 2013-08-29 LAB — WET PREP BY MOLECULAR PROBE
CANDIDA SPECIES: NEGATIVE
Gardnerella vaginalis: NEGATIVE
Trichomonas vaginosis: NEGATIVE

## 2013-08-29 LAB — GC/CHLAMYDIA PROBE AMP
CT Probe RNA: NEGATIVE
GC Probe RNA: NEGATIVE

## 2013-08-29 LAB — URINALYSIS, MICROSCOPIC ONLY
Bacteria, UA: NONE SEEN
Casts: NONE SEEN
Crystals: NONE SEEN

## 2013-08-29 LAB — URINALYSIS, ROUTINE W REFLEX MICROSCOPIC
BILIRUBIN URINE: NEGATIVE
Glucose, UA: NEGATIVE mg/dL
KETONES UR: NEGATIVE mg/dL
Leukocytes, UA: NEGATIVE
Nitrite: NEGATIVE
PH: 6 (ref 5.0–8.0)
Protein, ur: NEGATIVE mg/dL
Specific Gravity, Urine: 1.014 (ref 1.005–1.030)
Urobilinogen, UA: 0.2 mg/dL (ref 0.0–1.0)

## 2013-08-29 LAB — RPR

## 2013-12-23 ENCOUNTER — Encounter: Payer: Self-pay | Admitting: Obstetrics & Gynecology

## 2013-12-31 ENCOUNTER — Telehealth: Payer: Self-pay | Admitting: *Deleted

## 2013-12-31 NOTE — Telephone Encounter (Signed)
Patient called the office regarding an IUD. Attempted to contact patient and unable to leave a message.

## 2014-01-01 ENCOUNTER — Ambulatory Visit (INDEPENDENT_AMBULATORY_CARE_PROVIDER_SITE_OTHER): Payer: Self-pay | Admitting: Family Medicine

## 2014-01-01 VITALS — BP 118/76 | HR 83 | Temp 98.4°F | Resp 16 | Ht 63.5 in | Wt 134.0 lb

## 2014-01-01 DIAGNOSIS — R5383 Other fatigue: Secondary | ICD-10-CM

## 2014-01-01 DIAGNOSIS — F329 Major depressive disorder, single episode, unspecified: Secondary | ICD-10-CM

## 2014-01-01 DIAGNOSIS — L708 Other acne: Secondary | ICD-10-CM

## 2014-01-01 DIAGNOSIS — F4323 Adjustment disorder with mixed anxiety and depressed mood: Secondary | ICD-10-CM

## 2014-01-01 DIAGNOSIS — F32A Depression, unspecified: Secondary | ICD-10-CM

## 2014-01-01 DIAGNOSIS — R55 Syncope and collapse: Secondary | ICD-10-CM

## 2014-01-01 LAB — POCT CBC
Granulocyte percent: 75.5 %G (ref 37–80)
HCT, POC: 44.3 % (ref 37.7–47.9)
HEMOGLOBIN: 14.2 g/dL (ref 12.2–16.2)
LYMPH, POC: 1.8 (ref 0.6–3.4)
MCH: 28.8 pg (ref 27–31.2)
MCHC: 32 g/dL (ref 31.8–35.4)
MCV: 89.9 fL (ref 80–97)
MID (CBC): 0.5 (ref 0–0.9)
MPV: 7.4 fL (ref 0–99.8)
PLATELET COUNT, POC: 321 10*3/uL (ref 142–424)
POC GRANULOCYTE: 7.1 — AB (ref 2–6.9)
POC LYMPH PERCENT: 19.1 %L (ref 10–50)
POC MID %: 5.4 % (ref 0–12)
RBC: 4.92 M/uL (ref 4.04–5.48)
RDW, POC: 12.8 %
WBC: 9.4 10*3/uL (ref 4.6–10.2)

## 2014-01-01 LAB — COMPREHENSIVE METABOLIC PANEL
ALBUMIN: 5 g/dL (ref 3.5–5.2)
ALT: 10 U/L (ref 0–35)
AST: 18 U/L (ref 0–37)
Alkaline Phosphatase: 58 U/L (ref 39–117)
BILIRUBIN TOTAL: 0.4 mg/dL (ref 0.2–1.2)
BUN: 9 mg/dL (ref 6–23)
CO2: 25 mEq/L (ref 19–32)
Calcium: 9.7 mg/dL (ref 8.4–10.5)
Chloride: 102 mEq/L (ref 96–112)
Creat: 0.65 mg/dL (ref 0.50–1.10)
GLUCOSE: 93 mg/dL (ref 70–99)
POTASSIUM: 4.8 meq/L (ref 3.5–5.3)
SODIUM: 138 meq/L (ref 135–145)
TOTAL PROTEIN: 7.8 g/dL (ref 6.0–8.3)

## 2014-01-01 LAB — POCT UA - MICROSCOPIC ONLY
CASTS, UR, LPF, POC: NEGATIVE
Mucus, UA: NEGATIVE
WBC, Ur, HPF, POC: NEGATIVE
Yeast, UA: NEGATIVE

## 2014-01-01 LAB — POCT URINALYSIS DIPSTICK
Bilirubin, UA: NEGATIVE
Glucose, UA: NEGATIVE
Ketones, UA: NEGATIVE
LEUKOCYTES UA: NEGATIVE
NITRITE UA: NEGATIVE
PROTEIN UA: NEGATIVE
Spec Grav, UA: 1.025
UROBILINOGEN UA: 0.2
pH, UA: 5.5

## 2014-01-01 LAB — POCT URINE PREGNANCY: Preg Test, Ur: NEGATIVE

## 2014-01-01 NOTE — Progress Notes (Signed)
Subjective: 28 year old Hispanic American lady who speaks good [place]. She is here with a history of having not felt well the last couple of weeks. However last few days she's been feeling worse. She feels rundown and tired. She has been breaking out with more acne on her face. She got up and went to the bathroom this morning. She looked in the mirror and she looked pale. She was feeling dizzy. She called her husband because she is feeling like she might faint. She did not get back to her room because she did faint, falling over a table that she broke. No injuries from that. She continues to feel lightheaded. Has a little headache but no specific pains. She has been under good deal of stress. She is married, has a child at home. She is very concerned about an aunt of hers who has quite ill in [place]. Her last menstrual cycle she is not certain about. Her menses are irregular. She does have an IUD, and this had it longer than the designated time it should've been him. She does not think pregnant.  Objective: Pleasant lady, alert and oriented, seems a little depressed. She said that she does get sad easily. Her TMs are normal. Eyes PERRLA. Conjunctiva slightly pale. Throat clear. She does have some scattered cystic acne on her face. Chest is clear to auscultation. Heart regular without murmurs. Neck was supple without nodes. Abdomen soft without mass or tenderness.  Assessment: Generalized malaise Fatigue Dizziness Fainting spell at home Paleness  Plan: Labs Results for orders placed or performed in visit on 01/01/14  POCT CBC  Result Value Ref Range   WBC 9.4 4.6 - 10.2 K/uL   Lymph, poc 1.8 0.6 - 3.4   POC LYMPH PERCENT 19.1 10 - 50 %L   MID (cbc) 0.5 0 - 0.9   POC MID % 5.4 0 - 12 %M   POC Granulocyte 7.1 (A) 2 - 6.9   Granulocyte percent 75.5 37 - 80 %G   RBC 4.92 4.04 - 5.48 M/uL   Hemoglobin 14.2 12.2 - 16.2 g/dL   HCT, POC 16.144.3 09.637.7 - 47.9 %   MCV 89.9 80 - 97 fL   MCH, POC  28.8 27 - 31.2 pg   MCHC 32.0 31.8 - 35.4 g/dL   RDW, POC 04.512.8 %   Platelet Count, POC 321.0 142 - 424 K/uL   MPV 7.4 0 - 99.8 fL  POCT urinalysis dipstick  Result Value Ref Range   Color, UA yellow    Clarity, UA cloudy    Glucose, UA neg    Bilirubin, UA neg    Ketones, UA neg    Spec Grav, UA 1.025    Blood, UA trace    pH, UA 5.5    Protein, UA neg    Urobilinogen, UA 0.2    Nitrite, UA neg    Leukocytes, UA Negative   POCT urine pregnancy  Result Value Ref Range   Preg Test, Ur Negative   POCT UA - Microscopic Only  Result Value Ref Range   WBC, Ur, HPF, POC neg    RBC, urine, microscopic 0-1    Bacteria, U Microscopic trace    Mucus, UA neg    Epithelial cells, urine per micros 0-1    Crystals, Ur, HPF, POC amorphous crystals    Casts, Ur, LPF, POC neg    Yeast, UA neg      Plan U/A, CBC, hcg   Vasovagal syncope, etiology unclear.  Will have her return if needed. Treat symptomatically. Give her a couple days off work. I think she has some depression and anxiety issues. See the instruction sheet or gave her. She did not desire any medications today.

## 2014-01-01 NOTE — Patient Instructions (Signed)
Drink plenty of fluids  Stay off work through tomorrow  If having further episodes of dizziness or lightheadedness or fainting please return  Consider whether you may need something for anxiety and depression. Also consider the possibility of getting some counseling if symptoms persist. I would recommend Karmen BongoAaron Stewart, psychologist. [phone] or Soledad Gerlachobyn Ingram, [phone] as people to consider for counseling  Return at any time if worse.   When feeling better begin getting exercise regularly.  I will let you know the results of the remaining labs. Syncope Syncope is a medical term for fainting or passing out. This means you lose consciousness and drop to the ground. People are generally unconscious for less than 5 minutes. You may have some muscle twitches for up to 15 seconds before waking up and returning to normal. Syncope occurs more often in older adults, but it can happen to anyone. While most causes of syncope are not dangerous, syncope can be a sign of a serious medical problem. It is important to seek medical care.  CAUSES  Syncope is caused by a sudden drop in blood flow to the brain. The specific cause is often not determined. Factors that can bring on syncope include:  Taking medicines that lower blood pressure.  Sudden changes in posture, such as standing up quickly.  Taking more medicine than prescribed.  Standing in one place for too long.  Seizure disorders.  Dehydration and excessive exposure to heat.  Low blood sugar (hypoglycemia).  Straining to have a bowel movement.  Heart disease, irregular heartbeat, or other circulatory problems.  Fear, emotional distress, seeing blood, or severe pain. SYMPTOMS  Right before fainting, you may:  Feel dizzy or light-headed.  Feel nauseous.  See all white or all black in your field of vision.  Have cold, clammy skin. DIAGNOSIS  Your health care provider will ask about your symptoms, perform a physical exam, and  perform an electrocardiogram (ECG) to record the electrical activity of your heart. Your health care provider may also perform other heart or blood tests to determine the cause of your syncope which may include:  Transthoracic echocardiogram (TTE). During echocardiography, sound waves are used to evaluate how blood flows through your heart.  Transesophageal echocardiogram (TEE).  Cardiac monitoring. This allows your health care provider to monitor your heart rate and rhythm in real time.  Holter monitor. This is a portable device that records your heartbeat and can help diagnose heart arrhythmias. It allows your health care provider to track your heart activity for several days, if needed.  Stress tests by exercise or by giving medicine that makes the heart beat faster. TREATMENT  In most cases, no treatment is needed. Depending on the cause of your syncope, your health care provider may recommend changing or stopping some of your medicines. HOME CARE INSTRUCTIONS  Have someone stay with you until you feel stable.  Do not drive, use machinery, or play sports until your health care provider says it is okay.  Keep all follow-up appointments as directed by your health care provider.  Lie down right away if you start feeling like you might faint. Breathe deeply and steadily. Wait until all the symptoms have passed.  Drink enough fluids to keep your urine clear or pale yellow.  If you are taking blood pressure or heart medicine, get up slowly and take several minutes to sit and then stand. This can reduce dizziness. SEEK IMMEDIATE MEDICAL CARE IF:   You have a severe headache.  You have unusual  pain in the chest, abdomen, or back.  You are bleeding from your mouth or rectum, or you have black or tarry stool.  You have an irregular or very fast heartbeat.  You have pain with breathing.  You have repeated fainting or seizure-like jerking during an episode.  You faint when sitting or  lying down.  You have confusion.  You have trouble walking.  You have severe weakness.  You have vision problems. If you fainted, call your local emergency services (911 in [place] Do not drive yourself to the hospital.  MAKE SURE YOU:  Understand these instructions.  Will watch your condition.  Will get help right away if you are not doing well or get worse. Document Released: 02/07/2005 Document Revised: 02/12/2013 Document Reviewed: 04/08/2011 [facility] Patient Information 2015 [place], [place]. This information is not intended to replace advice given to you by your health care provider. Make sure you discuss any questions you have with your health care provider.

## 2014-01-17 NOTE — Telephone Encounter (Signed)
Patient currently has no insurance and is requesting a price for the Removal of the IUD and the cost for insertion of the new one. Advised patient would find out that information and given her a call back. Patient also given information to contact [facility] to see if they could arrange payment plan for a new device.

## 2014-01-24 NOTE — Telephone Encounter (Signed)
Per Olegario MessierKathy in billing the cost for her appointment for the Removal and Insertion of a new Mirena will be $110 provided she is set up on a payment plan through [facility] and they send the IUD to the office.  Attempted to contact the patient, left a message informing patient of the cost of the visit. Patient advised to contact the office when she is ready to schedule the appointment.

## 2014-02-17 ENCOUNTER — Encounter: Payer: Self-pay | Admitting: *Deleted

## 2014-02-18 ENCOUNTER — Encounter: Payer: Self-pay | Admitting: Obstetrics & Gynecology

## 2014-04-30 ENCOUNTER — Ambulatory Visit (INDEPENDENT_AMBULATORY_CARE_PROVIDER_SITE_OTHER): Payer: Self-pay | Admitting: Physician Assistant

## 2014-04-30 VITALS — BP 116/70 | HR 80 | Temp 99.6°F | Resp 18 | Ht 63.5 in | Wt 147.0 lb

## 2014-04-30 DIAGNOSIS — B9789 Other viral agents as the cause of diseases classified elsewhere: Principal | ICD-10-CM

## 2014-04-30 DIAGNOSIS — R509 Fever, unspecified: Secondary | ICD-10-CM

## 2014-04-30 DIAGNOSIS — J069 Acute upper respiratory infection, unspecified: Secondary | ICD-10-CM

## 2014-04-30 LAB — POCT CBC
Granulocyte percent: 77.9 %G (ref 37–80)
HCT, POC: 43.6 % (ref 37.7–47.9)
Hemoglobin: 13.8 g/dL (ref 12.2–16.2)
LYMPH, POC: 0.9 (ref 0.6–3.4)
MCH, POC: 28.4 pg (ref 27–31.2)
MCHC: 31.7 g/dL — AB (ref 31.8–35.4)
MCV: 89.4 fL (ref 80–97)
MID (CBC): 0.5 (ref 0–0.9)
MPV: 7.2 fL (ref 0–99.8)
POC Granulocyte: 5.1 (ref 2–6.9)
POC LYMPH PERCENT: 14.4 %L (ref 10–50)
POC MID %: 7.7 %M (ref 0–12)
Platelet Count, POC: 212 10*3/uL (ref 142–424)
RBC: 4.88 M/uL (ref 4.04–5.48)
RDW, POC: 13.1 %
WBC: 6.5 10*3/uL (ref 4.6–10.2)

## 2014-04-30 LAB — POCT INFLUENZA A/B
INFLUENZA B, POC: NEGATIVE
Influenza A, POC: NEGATIVE

## 2014-04-30 MED ORDER — BENZONATATE 100 MG PO CAPS: 100.0000 mg | ORAL_CAPSULE | Freq: Three times a day (TID) | ORAL | Status: DC | PRN

## 2014-04-30 NOTE — Progress Notes (Signed)
Subjective:    Patient ID: Jill PerchesIris Ramirez, female    DOB: 07-29-1985, 29 y.o.   MRN: [id number]  HPI  This is a 29 year old female presenting with cough x 3 days. Last night she developed fever, chills, myalgias and sore throat. Cough is productive. One episode of post-tussive emesis last night. Temperature of 100 last night. She has been taking nyquil for sleep and helping. She has not taken anything today for her symptoms. She has a history of asthma - hasn't had to use albuterol in 1 year. No SOB or wheezing with current illness. She did not get the flu shot this year. Someone at work has the flu and daughter is currently getting over a virus. Not a smoker. She denies nasal congestion, otalgia or sinus pressure.  Review of Systems  Constitutional: Positive for fever and chills.  HENT: Positive for sore throat. Negative for congestion, ear pain and sinus pressure.   Eyes: Negative for redness.  Respiratory: Positive for cough. Negative for shortness of breath and wheezing.   Gastrointestinal: Positive for vomiting. Negative for nausea, abdominal pain and diarrhea.  Musculoskeletal: Positive for myalgias.  Skin: Negative for rash.  Hematological: Negative for adenopathy.  Psychiatric/Behavioral: Positive for sleep disturbance.    There are no active problems to display for this patient.  Prior to Admission medications   Medication Sig Start Date End Date Taking? Authorizing Provider  albuterol (PROVENTIL HFA;VENTOLIN HFA) 108 (90 BASE) MCG/ACT inhaler Inhale 2 puffs into the lungs every 4 (four) hours as needed for wheezing (cough, shortness of breath or wheezing.). 06/17/12  Yes Chelle Tessa LernerS Jeffery, PA-C  levonorgestrel (MIRENA) 20 MCG/24HR IUD 1 each by Intrauterine route once.   Yes Historical Provider, MD          Allergies  Allergen Reactions  . Vicodin [Hydrocodone-Acetaminophen] Swelling   Patient's social and family history were reviewed.     Objective:   Physical Exam    Constitutional: She is oriented to person, place, and time. She appears well-developed and well-nourished. No distress.  HENT:  Head: Normocephalic and atraumatic.  Right Ear: Hearing, tympanic membrane, external ear and ear canal normal.  Left Ear: Hearing, tympanic membrane, external ear and ear canal normal.  Nose: Nose normal. Right sinus exhibits no maxillary sinus tenderness and no frontal sinus tenderness. Left sinus exhibits no maxillary sinus tenderness and no frontal sinus tenderness.  Mouth/Throat: Uvula is midline and mucous membranes are normal. Posterior oropharyngeal erythema present. No oropharyngeal exudate or posterior oropharyngeal edema.  Eyes: Conjunctivae and lids are normal. Right eye exhibits no discharge. Left eye exhibits no discharge. No scleral icterus.  Neck: No Brudzinski's sign noted.  Cardiovascular: Normal rate, regular rhythm, normal heart sounds, intact distal pulses and normal pulses.   No murmur heard. Pulmonary/Chest: Effort normal and breath sounds normal. No respiratory distress. She has no wheezes. She has no rhonchi. She has no rales.  Abdominal: Soft. Normal appearance.  Mild epigastric tenderness  Musculoskeletal: Normal range of motion.  Lymphadenopathy:    She has cervical adenopathy (right anterior).  Neurological: She is alert and oriented to person, place, and time.  Skin: Skin is warm, dry and intact. No lesion and no rash noted.  Psychiatric: She has a normal mood and affect. Her speech is normal and behavior is normal. Thought content normal.   BP 116/70 mmHg  Pulse 80  Temp(Src) 99.6 F (37.6 C) (Oral)  Resp 18  Ht 5' 3.5" (1.613 m)  Wt 147 lb (  66.679 kg)  BMI 25.63 kg/m2  SpO2 96%  Results for orders placed or performed in visit on 04/30/14  POCT Influenza A/B  Result Value Ref Range   Influenza A, POC Negative    Influenza B, POC Negative   POCT CBC  Result Value Ref Range   WBC 6.5 4.6 - 10.2 K/uL   Lymph, poc 0.9 0.6 -  3.4   POC LYMPH PERCENT 14.4 10 - 50 %L   MID (cbc) 0.5 0 - 0.9   POC MID % 7.7 0 - 12 %M   POC Granulocyte 5.1 2 - 6.9   Granulocyte percent 77.9 37 - 80 %G   RBC 4.88 4.04 - 5.48 M/uL   Hemoglobin 13.8 12.2 - 16.2 g/dL   HCT, POC 16.1 09.6 - 47.9 %   MCV 89.4 80 - 97 fL   MCH, POC 28.4 27 - 31.2 pg   MCHC 31.7 (A) 31.8 - 35.4 g/dL   RDW, POC 04.5 %   Platelet Count, POC 212 142 - 424 K/uL   MPV 7.2 0 - 99.8 fL      Assessment & Plan:  1. Fever, unspecified fever cause 2. Viral URI with cough CBC wnl and influenza negative. This is likely a viral illness. She will continue with nyquil for sleep and take tessalon during the day. Counseled on tylenol/ibuprofen, rest, fluids. She will return in 5-7 days if symptoms are not improving.  - POCT Influenza A/B - POCT CBC - benzonatate (TESSALON) 100 MG capsule; Take 1-2 capsules (100-200 mg total) by mouth 3 (three) times daily as needed for cough.  Dispense: 40 capsule; Refill: 0   [place] [employer]. Dyke Brackett, MHS [facility]  04/30/2014

## 2014-04-30 NOTE — Patient Instructions (Signed)
Take tessalon during the day for cough. Take nyquil at night to help you sleep. Tylenol/ibuprofen for pain/fever. Drink plenty of fluids and get plenty of rest. Return in 5-7 days if you are not getting better.

## 2014-05-01 ENCOUNTER — Telehealth: Payer: Self-pay

## 2014-05-01 DIAGNOSIS — J4520 Mild intermittent asthma, uncomplicated: Secondary | ICD-10-CM

## 2014-05-01 NOTE — Telephone Encounter (Signed)
Pt was seen on 3/9 by Lanier ClamNicole Bush. She is not feeling any better and she would like a prescription of albuterol (PROVENTIL HFA;VENTOLIN HFA) 108 (90 BASE) MCG/ACT inhaler [[id number]][[id number]]. Please advise at [phone]

## 2014-05-05 MED ORDER — ALBUTEROL SULFATE HFA 108 (90 BASE) MCG/ACT IN AERS: 2.0000 | INHALATION_SPRAY | RESPIRATORY_TRACT | Status: DC | PRN

## 2014-05-05 NOTE — Telephone Encounter (Signed)
Albuterol refilled. Pt states she is feeling much better.

## 2014-10-06 ENCOUNTER — Ambulatory Visit (INDEPENDENT_AMBULATORY_CARE_PROVIDER_SITE_OTHER): Payer: Self-pay | Admitting: Gynecology

## 2014-10-06 ENCOUNTER — Encounter: Payer: Self-pay | Admitting: Gynecology

## 2014-10-06 ENCOUNTER — Other Ambulatory Visit (HOSPITAL_COMMUNITY)
Admission: RE | Admit: 2014-10-06 | Discharge: 2014-10-06 | Disposition: A | Discharge status: Home / Self Care | Payer: Self-pay | Source: Ambulatory Visit | Attending: Gynecology | Admitting: Gynecology

## 2014-10-06 VITALS — BP 118/76 | Ht 64.0 in | Wt 142.0 lb

## 2014-10-06 DIAGNOSIS — Z01419 Encounter for gynecological examination (general) (routine) without abnormal findings: Secondary | ICD-10-CM | POA: Insufficient documentation

## 2014-10-06 DIAGNOSIS — Z1151 Encounter for screening for human papillomavirus (HPV): Secondary | ICD-10-CM | POA: Insufficient documentation

## 2014-10-06 NOTE — Progress Notes (Signed)
Jill Ramirez 05/25/1985 [id number]   History:    29 y.o.  for annual gyn exam who is a new patient to the practice. Patient had a Mirena IUD placed 9 years ago. Patient reports very minimal if any menstrual cycles. She has complaining on and off at times of left lower quadrant discomfort. Patient's last Pap smear was 2 years ago. She reports no history of abnormal Pap smears.  Past medical history,surgical history, family history and social history were all reviewed and documented in the EPIC chart.  Gynecologic History No LMP recorded. Patient is not currently having periods (Reason: IUD). Contraception: IUD Last Pap: 2 years ago. Results were: normal Last mammogram: Not indicated. Results were: Not indicated  Obstetric History OB History  Gravida Para Term Preterm AB SAB TAB Ectopic Multiple Living  # Outcome Date GA Lbr Len/2nd Weight Sex Delivery Anes PTL Lv  1 Term 03/25/05 [redacted]w[redacted]d  7 lb (3.175 kg) F Vag-Spont None  Y       ROS: A ROS was performed and pertinent positives and negatives are included in the history.  GENERAL: No fevers or chills. HEENT: No change in vision, no earache, sore throat or sinus congestion. NECK: No pain or stiffness. CARDIOVASCULAR: No chest pain or pressure. No palpitations. PULMONARY: No shortness of breath, cough or wheeze. GASTROINTESTINAL: No abdominal pain, nausea, vomiting or diarrhea, melena or bright red blood per rectum. GENITOURINARY: No urinary frequency, urgency, hesitancy or dysuria. MUSCULOSKELETAL: No joint or muscle pain, no back pain, no recent trauma. DERMATOLOGIC: No rash, no itching, no lesions. ENDOCRINE: No polyuria, polydipsia, no heat or cold intolerance. No recent change in weight. HEMATOLOGICAL: No anemia or easy bruising or bleeding. NEUROLOGIC: No headache, seizures, numbness, tingling or weakness. PSYCHIATRIC: No depression, no loss of interest in normal activity or change in sleep pattern.      Exam: chaperone present  BP 118/76 mmHg  Ht  (1.626 m)  Wt 142 lb (64.411 kg)  BMI 24.36 kg/m2  Body mass index is 24.36 kg/(m^2).  General appearance : Well developed well nourished female. No acute distress HEENT: Eyes: no retinal hemorrhage or exudates,  Neck supple, trachea midline, no carotid bruits, no thyroidmegaly Lungs: Clear to auscultation, no rhonchi or wheezes, or rib retractions  Heart: Regular rate and rhythm, no murmurs or gallops Breast:Examined in sitting and supine position were symmetrical in appearance, no palpable masses or tenderness,  no skin retraction, no nipple inversion, no nipple discharge, no skin discoloration, no axillary or supraclavicular lymphadenopathy Abdomen: no palpable masses or tenderness, no rebound or guarding Extremities: no edema or skin discoloration or tenderness  Pelvic:  Bartholin, Urethra, Skene Glands: Within normal limits             Vagina: No gross lesions or discharge  Cervix: No gross lesions or discharge, IUD string seen  Uterus  anteverted, normal size, shape and consistency, non-tender and mobile  Adnexa  Without masses or tenderness  Anus and perineum  normal   Rectovaginal  normal sphincter tone without palpated masses or tenderness             Hemoccult not indicated   After the Pap smear was obtained the IUD string was grasped with a [place] clamp retrieved shown to the patient discarded  Assessment/Plan:  29 y.o. female for annual exam had a Mirena IUD placed 9 years ago removed today. Patient will use barrier  contraception until she returns to have an IUD placed. Because she is paying out of pocket the most economical and effective IUD would be the Liletta  for which literature information was provided in Spanish patient will return back to the office next week in a fasting state for the following screening blood work: [facility] a metabolic panel, fasting lipid profile, TSH, CBC, and urinalysis. Pap smear was  done today.   Ok Edwards MD, 10:50 AM 10/06/2014

## 2014-10-06 NOTE — Addendum Note (Signed)
Addended by: Berna Spare A on: 10/06/2014 10:58 AM   Modules accepted: Orders

## 2014-10-06 NOTE — Patient Instructions (Signed)
Levonorgestrel intrauterine device (IUD) What is this medicine? LEVONORGESTREL IUD (LEE voe nor jes trel) is a contraceptive (birth control) device. The device is placed inside the uterus by a healthcare professional. It is used to prevent pregnancy and can also be used to treat heavy bleeding that occurs during your period. Depending on the device, it can be used for 3 to 5 years. This medicine may be used for other purposes; ask your health care provider or pharmacist if you have questions. COMMON BRAND NAME(S): LILETTA, Mirena, Skyla What should I tell my health care provider before I take this medicine? They need to know if you have any of these conditions: -abnormal Pap smear -cancer of the breast, uterus, or cervix -diabetes -endometritis -genital or pelvic infection now or in the past -have more than one sexual partner or your partner has more than one partner -heart disease -history of an ectopic or tubal pregnancy -immune system problems -IUD in place -liver disease or tumor -problems with blood clots or take blood-thinners -use intravenous drugs -uterus of unusual shape -vaginal bleeding that has not been explained -an unusual or allergic reaction to levonorgestrel, other hormones, silicone, or polyethylene, medicines, foods, dyes, or preservatives -pregnant or trying to get pregnant -breast-feeding How should I use this medicine? This device is placed inside the uterus by a health care professional. Talk to your pediatrician regarding the use of this medicine in children. Special care may be needed. Overdosage: If you think you have taken too much of this medicine contact a poison control center or emergency room at once. NOTE: This medicine is only for you. Do not share this medicine with others. What if I miss a dose? This does not apply. What may interact with this medicine? Do not take this medicine with any of the following  medications: -amprenavir -bosentan -fosamprenavir This medicine may also interact with the following medications: -aprepitant -barbiturate medicines for inducing sleep or treating seizures -bexarotene -griseofulvin -medicines to treat seizures like carbamazepine, ethotoin, felbamate, oxcarbazepine, phenytoin, topiramate -modafinil -pioglitazone -rifabutin -rifampin -rifapentine -some medicines to treat HIV infection like atazanavir, indinavir, lopinavir, nelfinavir, tipranavir, ritonavir -St. John's wort -warfarin This list may not describe all possible interactions. Give your health care provider a list of all the medicines, herbs, non-prescription drugs, or dietary supplements you use. Also tell them if you smoke, drink alcohol, or use illegal drugs. Some items may interact with your medicine. What should I watch for while using this medicine? Visit your doctor or health care professional for regular check ups. See your doctor if you or your partner has sexual contact with others, becomes HIV positive, or gets a sexual transmitted disease. This product does not protect you against HIV infection (AIDS) or other sexually transmitted diseases. You can check the placement of the IUD yourself by reaching up to the top of your vagina with clean fingers to feel the threads. Do not pull on the threads. It is a good habit to check placement after each menstrual period. Call your doctor right away if you feel more of the IUD than just the threads or if you cannot feel the threads at all. The IUD may come out by itself. You may become pregnant if the device comes out. If you notice that the IUD has come out use a backup birth control method like condoms and call your health care provider. Using tampons will not change the position of the IUD and are okay to use during your period. What side effects may   I notice from receiving this medicine? Side effects that you should report to your doctor or  health care professional as soon as possible: -allergic reactions like skin rash, itching or hives, swelling of the face, lips, or tongue -fever, flu-like symptoms -genital sores -high blood pressure -no menstrual period for 6 weeks during use -pain, swelling, warmth in the leg -pelvic pain or tenderness -severe or sudden headache -signs of pregnancy -stomach cramping -sudden shortness of breath -trouble with balance, talking, or walking -unusual vaginal bleeding, discharge -yellowing of the eyes or skin Side effects that usually do not require medical attention (report to your doctor or health care professional if they continue or are bothersome): -acne -breast pain -change in sex drive or performance -changes in weight -cramping, dizziness, or faintness while the device is being inserted -headache -irregular menstrual bleeding within first 3 to 6 months of use -nausea This list may not describe all possible side effects. Call your doctor for medical advice about side effects. You may report side effects to FDA at 1-800-FDA-1088. Where should I keep my medicine? This does not apply. NOTE: This sheet is a summary. It may not cover all possible information. If you have questions about this medicine, talk to your doctor, pharmacist, or health care provider.  2015, Elsevier/Gold Standard. (2011-03-10 13:54:04)  

## 2014-10-07 LAB — CYTOLOGY - PAP

## 2014-10-13 ENCOUNTER — Other Ambulatory Visit: Payer: Self-pay

## 2014-10-13 DIAGNOSIS — Z01419 Encounter for gynecological examination (general) (routine) without abnormal findings: Secondary | ICD-10-CM

## 2014-10-13 LAB — CBC WITH DIFFERENTIAL/PLATELET
Basophils Absolute: 0.1 10*3/uL (ref 0.0–0.1)
Basophils Relative: 1 % (ref 0–1)
Eosinophils Absolute: 0.3 10*3/uL (ref 0.0–0.7)
Eosinophils Relative: 6 % — ABNORMAL HIGH (ref 0–5)
HCT: 39 % (ref 36.0–46.0)
Hemoglobin: 13 g/dL (ref 12.0–15.0)
Lymphocytes Relative: 28 % (ref 12–46)
Lymphs Abs: 1.4 10*3/uL (ref 0.7–4.0)
MCH: 29.4 pg (ref 26.0–34.0)
MCHC: 33.3 g/dL (ref 30.0–36.0)
MCV: 88.2 fL (ref 78.0–100.0)
MONOS PCT: 9 % (ref 3–12)
MPV: 9.5 fL (ref 8.6–12.4)
Monocytes Absolute: 0.5 10*3/uL (ref 0.1–1.0)
NEUTROS ABS: 2.8 10*3/uL (ref 1.7–7.7)
Neutrophils Relative %: 56 % (ref 43–77)
Platelets: 241 10*3/uL (ref 150–400)
RBC: 4.42 MIL/uL (ref 3.87–5.11)
RDW: 13.3 % (ref 11.5–15.5)
WBC: 5 10*3/uL (ref 4.0–10.5)

## 2014-10-13 LAB — COMPREHENSIVE METABOLIC PANEL
ALBUMIN: 4.4 g/dL (ref 3.6–5.1)
ALK PHOS: 53 U/L (ref 33–115)
ALT: 14 U/L (ref 6–29)
AST: 26 U/L (ref 10–30)
BILIRUBIN TOTAL: 0.5 mg/dL (ref 0.2–1.2)
BUN: 12 mg/dL (ref 7–25)
CO2: 27 mmol/L (ref 20–31)
Calcium: 9.1 mg/dL (ref 8.6–10.2)
Chloride: 102 mmol/L (ref 98–110)
Creat: 0.72 mg/dL (ref 0.50–1.10)
Glucose, Bld: 93 mg/dL (ref 65–99)
Potassium: 4.5 mmol/L (ref 3.5–5.3)
Sodium: 138 mmol/L (ref 135–146)
Total Protein: 6.9 g/dL (ref 6.1–8.1)

## 2014-10-13 LAB — LIPID PANEL
Cholesterol: 179 mg/dL (ref 125–200)
HDL: 62 mg/dL (ref >=46)
LDL CALC: 106 mg/dL (ref <130)
TRIGLYCERIDES: 56 mg/dL (ref <150)
Total CHOL/HDL Ratio: 2.9 Ratio (ref <=5.0)
VLDL: 11 mg/dL (ref <30)

## 2014-10-13 LAB — TSH: TSH: 0.666 u[IU]/mL (ref 0.350–4.500)

## 2014-10-14 LAB — URINALYSIS W MICROSCOPIC + REFLEX CULTURE
BACTERIA UA: NONE SEEN [HPF]
BILIRUBIN URINE: NEGATIVE
CRYSTALS: NONE SEEN [HPF]
Casts: NONE SEEN [LPF]
Glucose, UA: NEGATIVE
Ketones, ur: NEGATIVE
Leukocytes, UA: NEGATIVE
Nitrite: NEGATIVE
PROTEIN: NEGATIVE
Specific Gravity, Urine: 1.019 (ref 1.001–1.035)
WBC, UA: NONE SEEN WBC/HPF (ref <=5)
YEAST: NONE SEEN [HPF]
pH: 6 (ref 5.0–8.0)

## 2014-10-16 ENCOUNTER — Other Ambulatory Visit: Payer: Self-pay | Admitting: Gynecology

## 2014-10-16 MED ORDER — NITROFURANTOIN MONOHYD MACRO 100 MG PO CAPS: 100.0000 mg | ORAL_CAPSULE | Freq: Two times a day (BID) | ORAL | Status: DC

## 2014-10-17 LAB — URINE CULTURE

## 2014-11-11 ENCOUNTER — Ambulatory Visit (INDEPENDENT_AMBULATORY_CARE_PROVIDER_SITE_OTHER): Payer: Self-pay | Admitting: Gynecology

## 2014-11-11 ENCOUNTER — Encounter: Payer: Self-pay | Admitting: Gynecology

## 2014-11-11 VITALS — BP 120/78

## 2014-11-11 DIAGNOSIS — Z975 Presence of (intrauterine) contraceptive device: Secondary | ICD-10-CM | POA: Insufficient documentation

## 2014-11-11 DIAGNOSIS — Z23 Encounter for immunization: Secondary | ICD-10-CM

## 2014-11-11 DIAGNOSIS — Z30430 Encounter for insertion of intrauterine contraceptive device: Secondary | ICD-10-CM

## 2014-11-11 NOTE — Patient Instructions (Signed)
Intrauterine Device Insertion, Care After Refer to this sheet in the next few weeks. These instructions provide you with information on caring for yourself after your procedure. Your health care Aeron Lheureux may also give you more specific instructions. Your treatment has been planned according to current medical practices, but problems sometimes occur. Call your health care Pasco Marchitto if you have any problems or questions after your procedure. WHAT TO EXPECT AFTER THE PROCEDURE Insertion of the IUD may cause some discomfort, such as cramping. The cramping should improve after the IUD is in place. You may have bleeding after the procedure. This is normal. It varies from light spotting for a few days to menstrual-like bleeding. When the IUD is in place, a string will extend past the cervix into the vagina for 1-2 inches. The strings should not bother you or your partner. If they do, talk to your health care Kensie Susman.  HOME CARE INSTRUCTIONS   Check your intrauterine device (IUD) to make sure it is in place before you resume sexual activity. You should be able to feel the strings. If you cannot feel the strings, something may be wrong. The IUD may have fallen out of the uterus, or the uterus may have been punctured (perforated) during placement. Also, if the strings are getting longer, it may mean that the IUD is being forced out of the uterus. You no longer have full protection from pregnancy if any of these problems occur.  You may resume sexual intercourse if you are not having problems with the IUD. The copper IUD is considered immediately effective, and the hormone IUD works right away if inserted within 7 days of your period starting. You will need to use a backup method of birth control for 7 days if the IUD in inserted at any other time in your cycle.  Continue to check that the IUD is still in place by feeling for the strings after every menstrual period.  You may need to take pain medicine such as  acetaminophen or ibuprofen. Only take medicines as directed by your health care Reise Hietala. SEEK MEDICAL CARE IF:   You have bleeding that is heavier or lasts longer than a normal menstrual cycle.  You have a fever.  You have increasing cramps or abdominal pain not relieved with medicine.  You have abdominal pain that does not seem to be related to the same area of earlier cramping and pain.  You are lightheaded, unusually weak, or faint.  You have abnormal vaginal discharge or smells.  You have pain during sexual intercourse.  You cannot feel the IUD strings, or the IUD string has gotten longer.  You feel the IUD at the opening of the cervix in the vagina.  You think you are pregnant, or you miss your menstrual period.  The IUD string is hurting your sex partner. MAKE SURE YOU:  Understand these instructions.  Will watch your condition.  Will get help right away if you are not doing well or get worse. Document Released: 10/06/2010 Document Revised: 11/28/2012 Document Reviewed: 07/29/2012 ExitCare Patient Information 2015 [employer]. This information is not intended to replace advice given to you by your health care Marilou Barnfield. Make sure you discuss any questions you have with your health care Nissa Stannard.  

## 2014-11-11 NOTE — Progress Notes (Signed)
    Patient is a 29 year old gravida 1 para 1 who was seen in the office for annual exam on August 15 and had a Mirena IUD removed that had been in place for 9 years that was placed at another facility. She is here for placement of a new Mirena IUD. She is currently menstruating.  Her recent lab work and Pap smear were normal.                                                                    IUD procedure note       Patient presented to the office today for placement of Mirena IUD. The patient had previously been provided with literature information on this method of contraception. The risks benefits and pros and cons were discussed and all her questions were answered. She is fully aware that this form of contraception is 99% effective and is good for 5 years.  Pelvic exam: Bartholin urethra Skene glands: Within normal limits Vagina: No lesions or discharge Cervix: No lesions or discharge Uterus: Retroverted position Adnexa: No masses or tenderness Rectal exam: Not done  The cervix was cleansed with Betadine solution. A single-tooth tenaculum was placed on the anterior cervical lip. The uterus sounded to 7 centimeter. The IUD was shown to the patient and inserted in a sterile fashion. The IUD string was trimmed. The single-tooth tenaculum was removed. Patient was instructed to return back to the office in one month for follow up.   Patient received the flu vaccine today.    Mirena IUD was placed 11/11/2014 good for 5 years. Lot number [id number]

## 2014-11-13 ENCOUNTER — Encounter: Payer: Self-pay | Admitting: Gynecology

## 2014-12-09 ENCOUNTER — Ambulatory Visit: Payer: Self-pay | Admitting: Gynecology

## 2015-01-19 ENCOUNTER — Encounter: Payer: Self-pay | Admitting: Women's Health

## 2015-01-19 ENCOUNTER — Ambulatory Visit (INDEPENDENT_AMBULATORY_CARE_PROVIDER_SITE_OTHER): Payer: Self-pay | Admitting: Women's Health

## 2015-01-19 VITALS — BP 122/80 | Temp 98.3°F | Ht 64.0 in | Wt 142.0 lb

## 2015-01-19 DIAGNOSIS — R350 Frequency of micturition: Secondary | ICD-10-CM

## 2015-01-19 DIAGNOSIS — B373 Candidiasis of vulva and vagina: Secondary | ICD-10-CM

## 2015-01-19 DIAGNOSIS — B3731 Acute candidiasis of vulva and vagina: Secondary | ICD-10-CM

## 2015-01-19 LAB — URINALYSIS W MICROSCOPIC + REFLEX CULTURE
BILIRUBIN URINE: NEGATIVE
CRYSTALS: NONE SEEN [HPF]
Casts: NONE SEEN [LPF]
GLUCOSE, UA: NEGATIVE
Ketones, ur: NEGATIVE
Nitrite: NEGATIVE
Protein, ur: NEGATIVE
SPECIFIC GRAVITY, URINE: 1.015 (ref 1.001–1.035)
YEAST: NONE SEEN [HPF]
pH: 7 (ref 5.0–8.0)

## 2015-01-19 LAB — WET PREP FOR TRICH, YEAST, CLUE
CLUE CELLS WET PREP: NONE SEEN
TRICH WET PREP: NONE SEEN
Yeast Wet Prep HPF POC: NONE SEEN

## 2015-01-19 MED ORDER — FLUCONAZOLE 150 MG PO TABS: 150.0000 mg | ORAL_TABLET | Freq: Once | ORAL | Status: DC

## 2015-01-19 NOTE — Patient Instructions (Signed)

## 2015-01-19 NOTE — Progress Notes (Signed)
Patient ID: Jill Ramirez, female   DOB: 14-Jun-1985, 29 y.o.   MRN: [id number] Presents with numerous mild complaints. Pain and burning with urination at initiation of stream, vaginal irritation especially at introitus, low left-sided abdominal cramping, questionable fever yesterday. Reports vaginal symptoms are the worst of discomfort experiencing. Mirena IUD with rare bleeding placed 10/2014. Same partner.  Exam: Appears well. Abdomen soft without rebound or radiation of pain. No CVAT. External genitalia extremely erythematous at introitus, no visible lesions ,speculum exam IUD strings visible, minimal discharge, wet prep negative. Bimanual no CMT or adnexal tenderness discomfort mid left abdominal area. UA: Trace blood, 1+ leukocytes, 6-10 WBCs, 0 - 2 RBCs, few bacteria  Clinical yeast  Plan: Urine culture pending. Diflucan 150 by mouth 1 dose with refill. Loose clothing, yeast prevention discussed. Instructed to call if symptoms persist.

## 2015-01-21 LAB — URINE CULTURE

## 2015-08-27 ENCOUNTER — Encounter: Payer: Self-pay | Admitting: Gynecology

## 2015-10-07 ENCOUNTER — Encounter: Payer: Self-pay | Admitting: Gynecology

## 2016-07-06 ENCOUNTER — Encounter: Payer: Self-pay | Admitting: Gynecology

## 2017-06-06 ENCOUNTER — Emergency Department (HOSPITAL_BASED_OUTPATIENT_CLINIC_OR_DEPARTMENT_OTHER)
Admission: EM | Admit: 2017-06-06 | Discharge: 2017-06-07 | Disposition: A | Discharge status: Home / Self Care | Payer: Self-pay | Attending: Emergency Medicine | Admitting: Emergency Medicine

## 2017-06-06 ENCOUNTER — Encounter (HOSPITAL_BASED_OUTPATIENT_CLINIC_OR_DEPARTMENT_OTHER): Payer: Self-pay | Admitting: *Deleted

## 2017-06-06 ENCOUNTER — Other Ambulatory Visit: Payer: Self-pay

## 2017-06-06 DIAGNOSIS — R109 Unspecified abdominal pain: Secondary | ICD-10-CM | POA: Insufficient documentation

## 2017-06-06 DIAGNOSIS — J45909 Unspecified asthma, uncomplicated: Secondary | ICD-10-CM | POA: Insufficient documentation

## 2017-06-06 DIAGNOSIS — R319 Hematuria, unspecified: Secondary | ICD-10-CM | POA: Insufficient documentation

## 2017-06-06 LAB — PREGNANCY, URINE: Preg Test, Ur: NEGATIVE

## 2017-06-06 MED ORDER — KETOROLAC TROMETHAMINE 30 MG/ML IJ SOLN
30.0000 mg | Freq: Once | INTRAMUSCULAR | Status: AC
  Administered 2017-06-06: 30 mg via INTRAVENOUS
  Filled 2017-06-06: qty 1

## 2017-06-06 MED ORDER — ONDANSETRON 4 MG PO TBDP: 4.0000 mg | ORAL_TABLET | ORAL | 0 refills | Status: DC | PRN

## 2017-06-06 MED ORDER — IBUPROFEN 600 MG PO TABS: 600.0000 mg | ORAL_TABLET | Freq: Four times a day (QID) | ORAL | 0 refills | Status: AC | PRN

## 2017-06-06 MED ORDER — ORPHENADRINE CITRATE ER 100 MG PO TB12: 100.0000 mg | ORAL_TABLET | Freq: Two times a day (BID) | ORAL | 0 refills | Status: DC

## 2017-06-06 NOTE — ED Provider Notes (Addendum)
[facility] EMERGENCY DEPARTMENT Provider Note   CSN: [id number] Arrival date & time: 06/06/17  1859     History   Chief Complaint Chief Complaint  Patient presents with  . Back Injury    HPI Jill Ramirez is a 32 y.o. female.  HPI She reports she started developing some right lower back pain today.  Reports that she had lifted some heavy boxes at work and put them in her car.  The time she did not notice any significant injury.  She reports gradually though she got tightening and discomfort in her lower back.  Reports it does radiate slightly around to her lower abdomen.  Denies any pain burning urgency with urination.  No vaginal discharge or bleeding.  No history of kidney stones.  No nausea vomiting or fever. Past Medical History:  Diagnosis Date  . Asthma     There are no active problems to display for this patient.   History reviewed. No pertinent surgical history.   OB History   None      Home Medications    Prior to Admission medications   Medication Sig Start Date End Date Taking? Authorizing Provider  ibuprofen (ADVIL,MOTRIN) 600 MG tablet Take 1 tablet (600 mg total) by mouth every 6 (six) hours as needed. 06/06/17   Arby Barrette, MD  ondansetron (ZOFRAN ODT) 4 MG disintegrating tablet Take 1 tablet (4 mg total) by mouth every 4 (four) hours as needed for nausea or vomiting. 06/06/17   Arby Barrette, MD  orphenadrine (NORFLEX) 100 MG tablet Take 1 tablet (100 mg total) by mouth 2 (two) times daily. 06/06/17   Arby Barrette, MD    Family History History reviewed. No pertinent family history.  Social History Social History   Tobacco Use  . Smoking status: Never Smoker  . Smokeless tobacco: Never Used  Substance Use Topics  . Alcohol use: Never    Frequency: Never  . Drug use: Never     Allergies   Vicodin [hydrocodone-acetaminophen]   Review of Systems Review of Systems  10 Systems reviewed and are negative for acute change  except as noted in the HPI.  Physical Exam Updated Vital Signs BP 114/82 (BP Location: Left Arm)   Pulse 66   Temp 98.5 F (36.9 C)   Resp 18   Ht 5\' 3"  (1.6 m)   Wt 70.3 kg (155 lb)   SpO2 98%   BMI 27.46 kg/m   Physical Exam  Constitutional: She is oriented to person, place, and time. She appears well-developed and well-nourished.  HENT:  Head: Normocephalic and atraumatic.  Eyes: Pupils are equal, round, and reactive to light. EOM are normal.  Neck: Neck supple.  Cardiovascular: Normal rate, regular rhythm, normal heart sounds and intact distal pulses.  Pulmonary/Chest: Effort normal and breath sounds normal.  Abdominal: Soft. Bowel sounds are normal. She exhibits no distension. There is no tenderness.  Musculoskeletal: Normal range of motion. She exhibits no edema.  Neurological: She is alert and oriented to person, place, and time. She has normal strength. Coordination normal. GCS eye subscore is 4. GCS verbal subscore is 5. GCS motor subscore is 6.  Skin: Skin is warm, dry and intact.  Psychiatric: She has a normal mood and affect.     ED Treatments / Results  Labs (all labs ordered are listed, but only abnormal results are displayed) Labs Reviewed  PREGNANCY, URINE  URINALYSIS, ROUTINE W REFLEX MICROSCOPIC    EKG None  Radiology No results  found.  Procedures Procedures (including critical care time)  Medications Ordered in ED Medications  ketorolac (TORADOL) 30 MG/ML injection 30 mg (30 mg Intravenous Given 06/06/17 2122)     Initial Impression / Assessment and Plan / ED Course  I have reviewed the triage vital signs and the nursing notes.  Pertinent labs & imaging results that were available during my care of the patient were reviewed by me and considered in my medical decision making (see chart for details).       Final Clinical Impressions(s) / ED Diagnoses   Final diagnoses:  Flank pain  Hematuria, unspecified type   Patient's pain was  significantly improved by Toradol.  She is in no acute distress.  History is suggestive of musculoskeletal pain.  Patient however does have trace hematuria.  Urinalysis result was significantly delayed due to sheen downtime.  Kidney stone is within the differential.  At this time, with pain controlled and patient clinically well, we discussed CT scan versus treatment and observation.  At this time, patient reports she is tired and does not wish to proceed with CT scan his symptoms are improved.  Return precautions are reviewed.  Plan is in place for straining the urine, taking pain medications as needed and scheduling follow-up. ED Discharge Orders        Ordered    ibuprofen (ADVIL,MOTRIN) 600 MG tablet  Every 6 hours PRN     06/06/17 2323    orphenadrine (NORFLEX) 100 MG tablet  2 times daily     06/06/17 2323    ondansetron (ZOFRAN ODT) 4 MG disintegrating tablet  Every 4 hours PRN     06/06/17 2323       Arby BarrettePfeiffer, Naraly Fritcher, MD 06/07/17 Newton Pigg0009    Arby BarrettePfeiffer, Armenta Erskin, MD 06/07/17 0010

## 2017-06-06 NOTE — Discharge Instructions (Signed)
1.  Take ibuprofen and Norflex for pain. 2.  You may have a kidney stone.  Often these will pass on their own.  Strain your urine to see if you catch a stone. 3.  You should schedule a recheck with a family doctor.  You may need other diagnostic tests if your pain does not resolve or new symptoms develop.  You may use the referral number in your discharge instructions to call for assistance in finding a doctor.  Guide is also been attached containing community resources. 4.  Return to the emergency department if your pain worsens, you see blood in your urine, you develop fever, vomiting or other concerning symptoms.

## 2017-06-06 NOTE — ED Triage Notes (Signed)
Pt c/o lower back injury after lifting heavy object x 8 hrs ago at work

## 2017-06-07 LAB — URINALYSIS, MICROSCOPIC (REFLEX): WBC, UA: NONE SEEN WBC/hpf (ref 0–5)

## 2017-06-07 LAB — URINALYSIS, ROUTINE W REFLEX MICROSCOPIC
Bilirubin Urine: NEGATIVE
GLUCOSE, UA: NEGATIVE mg/dL
KETONES UR: NEGATIVE mg/dL
LEUKOCYTES UA: NEGATIVE
Nitrite: NEGATIVE
PROTEIN: NEGATIVE mg/dL
Specific Gravity, Urine: 1.015 (ref 1.005–1.030)
pH: 7.5 (ref 5.0–8.0)

## 2017-06-08 ENCOUNTER — Ambulatory Visit: Payer: Self-pay | Admitting: Urgent Care

## 2017-06-08 ENCOUNTER — Ambulatory Visit (INDEPENDENT_AMBULATORY_CARE_PROVIDER_SITE_OTHER): Payer: Self-pay | Admitting: Urgent Care

## 2017-06-08 ENCOUNTER — Encounter: Payer: Self-pay | Admitting: Urgent Care

## 2017-06-08 VITALS — BP 124/82 | HR 76 | Temp 98.4°F | Resp 16

## 2017-06-08 DIAGNOSIS — R319 Hematuria, unspecified: Secondary | ICD-10-CM

## 2017-06-08 DIAGNOSIS — R109 Unspecified abdominal pain: Secondary | ICD-10-CM

## 2017-06-08 LAB — POC MICROSCOPIC URINALYSIS (UMFC): Mucus: ABSENT

## 2017-06-08 LAB — POCT URINALYSIS DIP (MANUAL ENTRY)
BILIRUBIN UA: NEGATIVE
BILIRUBIN UA: NEGATIVE mg/dL
GLUCOSE UA: NEGATIVE mg/dL
Leukocytes, UA: NEGATIVE
Nitrite, UA: NEGATIVE
PH UA: 6.5 (ref 5.0–8.0)
Protein Ur, POC: NEGATIVE mg/dL
SPEC GRAV UA: 1.01 (ref 1.010–1.025)
Urobilinogen, UA: 0.2 E.U./dL

## 2017-06-08 NOTE — Patient Instructions (Addendum)
Clico renal (Renal Colic) El clico renal es un dolor causado por el paso de un clculo en el rin. El dolor puede ser agudo e intenso. Puede sentirse en la espalda, el abdomen, al costado (fosa lumbar) o la ingle. Puede causar nuseas. El clico renal puede aparecer y desaparecer. INSTRUCCIONES PARA EL CUIDADO EN EL HOGAR Controle su afeccin para ver si hay cambios. Las siguientes medidas pueden servir para aliviar cualquier molestia que est sintiendo:  Tome los medicamentos solamente como se lo haya indicado el mdico.  Pregntele al mdico si puede tomar analgsicos de venta libre.  Beba suficiente lquido para mantener la orina clara o de color amarillo plido. Beba entre 6 y 8vasos de agua por da.  Limite la cantidad de sal que consume a menos de 2gramos por da.  Reduzca la cantidad de protenas de la dieta. Consuma menos carne, pescado, frutos secos y productos lcteos.  Evite alimentos como la espinaca, el ruibarbo, los frutos secos o el salvado, ya que pueden aumentar la probabilidad de que se formen clculos. SOLICITE ATENCIN MDICA SI:  Tiene fiebre o siente escalofros.  La orina se torna de color turbio o tiene olor fuerte.  Siente dolor o ardor al orinar. SOLICITE ATENCIN MDICA DE INMEDIATO SI:  El dolor en la fosa lumbar o la ingle se intensifica repentinamente.  Est confundido o desorientado, o pierde la conciencia. Esta informacin no tiene como fin reemplazar el consejo del mdico. Asegrese de hacerle al mdico cualquier pregunta que tenga. Document Released: 11/17/2004 Document Revised: 02/28/2014 Document Reviewed: 12/18/2013 Elsevier Interactive Patient Education  2018 Elsevier Inc.     IF you received an x-ray today, you will receive an invoice from [facility]. Please contact [facility] at [phone] with questions or concerns regarding your invoice.   IF you received labwork today, you will receive an invoice from  [facility]. Please contact [facility] at [phone] with questions or concerns regarding your invoice.   Our billing staff will not be able to assist you with questions regarding bills from these companies.  You will be contacted with the lab results as soon as they are available. The fastest way to get your results is to activate your My Chart account. Instructions are located on the last page of this paperwork. If you have not heard from us regarding the results in 2 weeks, please contact this office.      

## 2017-06-08 NOTE — Progress Notes (Addendum)
    MRN: [id number] DOB: [birth year]  Subjective:   Jill Ramirez is a 32 y.o. female presenting for follow up on flank pain. Patient was seen at the ER on 06/06/2017 for low back pain, right flank pain. Patient was found to have hematuria and discussed possibility of renal stones, was prescribed Zofran, ibuprofen and orphenadrine. Patient did not pursue CT scan. Today, she presents for recheck. Denies fever, n/v, dysuria, hematuria. She notes that her pain has improved. She is having urinary frequency but is hydrating aggressively as recommended by her ER provider. Denies smoking cigarettes. Her pregnancy test was negative in the ER.  Jill has a current medication list which includes the following prescription(s): ibuprofen, ondansetron, and orphenadrine. Also is allergic to vicodin [hydrocodone-acetaminophen].  Jill  has a past medical history of Asthma. Denies past surgical history.  Objective:   Vitals: BP 124/82   Pulse 76   Temp 98.4 F (36.9 C) (Oral)   Resp 16   SpO2 99%   Physical Exam  Constitutional: She is oriented to person, place, and time. She appears well-developed and well-nourished.  HENT:  Mouth/Throat: Oropharynx is clear and moist.  Eyes: No scleral icterus.  Cardiovascular: Normal rate, regular rhythm and intact distal pulses. Exam reveals no gallop and no friction rub.  No murmur heard. Pulmonary/Chest: No respiratory distress. She has no wheezes. She has no rales.  Abdominal: Soft. Bowel sounds are normal. She exhibits no distension and no mass. There is tenderness (right-sided including RUQ). There is no rebound and no guarding.  No CVA tenderness.  Musculoskeletal: She exhibits no edema.  Neurological: She is alert and oriented to person, place, and time.  Skin: Skin is warm and dry. No rash noted. No erythema. No pallor.  Psychiatric: She has a normal mood and affect.   Results for orders placed or performed in visit on 06/08/17 (from the past 24 hour(s))    POCT urinalysis dipstick     Status: Abnormal   Collection Time: 06/08/17  1:24 PM  Result Value Ref Range   Color, UA yellow yellow   Clarity, UA clear clear   Glucose, UA negative negative mg/dL   Bilirubin, UA negative negative   Ketones, POC UA negative negative mg/dL   Spec Grav, UA [id number] [id number] - 1.025   Blood, UA small (A) negative   pH, UA 6.5 5.0 - 8.0   Protein Ur, POC negative negative mg/dL   Urobilinogen, UA 0.2 0.2 or 1.0 E.U./dL   Nitrite, UA Negative Negative   Leukocytes, UA Negative Negative  POCT Microscopic Urinalysis ([facility])     Status: Abnormal   Collection Time: 06/08/17  1:30 PM  Result Value Ref Range   WBC,UR,HPF,POC None None WBC/hpf   RBC,UR,HPF,POC None None RBC/hpf   Bacteria None None, Too numerous to count   Mucus Absent Absent   Epithelial Cells, UR Per Microscopy Few (A) None, Too numerous to count cells/hpf    Assessment and Plan :   Flank pain - Plan: POCT urinalysis dipstick, POCT Microscopic Urinalysis ([facility])  Hematuria, unspecified type - Plan: POCT urinalysis dipstick, POCT Microscopic Urinalysis ([facility])  Will manage as renal colic. Patient denies history of smoking and technically did not have painless hematuria given her flank pain. Monitor symptoms and return-to-clinic precautions discussed, patient verbalized understanding.   Wallis BambergMario Kellis Mcadam, PA-C [facility] [phone] 06/08/2017  1:09 PM

## 2017-06-08 NOTE — Addendum Note (Signed)
Addended by: Wallis BambergMANI, Maverik Foot on: 06/08/2017 01:38 PM   Modules accepted: Orders

## 2017-06-09 ENCOUNTER — Encounter: Payer: Self-pay | Admitting: Women's Health

## 2017-06-09 LAB — COMPREHENSIVE METABOLIC PANEL
A/G RATIO: 2 (ref 1.2–2.2)
ALBUMIN: 4.8 g/dL (ref 3.5–5.5)
ALT: 17 IU/L (ref 0–32)
AST: 12 IU/L (ref 0–40)
Alkaline Phosphatase: 70 IU/L (ref 39–117)
BILIRUBIN TOTAL: 0.2 mg/dL (ref 0.0–1.2)
BUN / CREAT RATIO: 9 (ref 9–23)
BUN: 7 mg/dL (ref 6–20)
CALCIUM: 9.6 mg/dL (ref 8.7–10.2)
CHLORIDE: 102 mmol/L (ref 96–106)
CO2: 26 mmol/L (ref 20–29)
Creatinine, Ser: 0.77 mg/dL (ref 0.57–1.00)
GFR calc Af Amer: 117 mL/min/{1.73_m2} (ref >59)
GFR, EST NON AFRICAN AMERICAN: 102 mL/min/{1.73_m2} (ref >59)
GLOBULIN, TOTAL: 2.4 g/dL (ref 1.5–4.5)
Glucose: 76 mg/dL (ref 65–99)
POTASSIUM: 4.8 mmol/L (ref 3.5–5.2)
Sodium: 141 mmol/L (ref 134–144)
Total Protein: 7.2 g/dL (ref 6.0–8.5)

## 2017-06-15 ENCOUNTER — Encounter: Payer: Self-pay | Admitting: Urgent Care

## 2017-07-12 ENCOUNTER — Encounter: Payer: Self-pay | Admitting: Women's Health

## 2017-07-12 ENCOUNTER — Ambulatory Visit (INDEPENDENT_AMBULATORY_CARE_PROVIDER_SITE_OTHER): Payer: Self-pay | Admitting: Women's Health

## 2017-07-12 VITALS — BP 118/76 | Ht 63.0 in | Wt 155.0 lb

## 2017-07-12 DIAGNOSIS — Z124 Encounter for screening for malignant neoplasm of cervix: Secondary | ICD-10-CM

## 2017-07-12 DIAGNOSIS — Z01419 Encounter for gynecological examination (general) (routine) without abnormal findings: Secondary | ICD-10-CM

## 2017-07-12 NOTE — Progress Notes (Addendum)
Jill Ramirez January 28, 1986 [id number]    History:    32 y/o MWF G1P1 presents for annual exam. Mirena IUD inserted 10/2014. Minimal spotting. Normal pap history . Denies need for STD screening. Occasional pelvic pain and bleeding (spotting) with sex. ED visit for kidney stones 05/2017 (resolved).  Normal CMP.10 pound weight gain in the past year normal CBC history.  Request minimum, without insurance.  Past medical history, past surgical history, family history and social history were all reviewed and documented in the EPIC chart. Works for [employer]. Seasonal asthma with no complications or chronic medication. Good long-term relationship with spouse. Has 1 daughter 52 y/o who has been accepted into [place] and she also ice skates.  ROS:  A ROS was performed and pertinent positives and negatives are included.  Exam:  Vitals:   07/12/17 0849  BP: 118/76  Weight: 155 lb (70.3 kg)  Height:  (1.6 m)   Body mass index is 27.46 kg/m.   General appearance:  Normal Thyroid:  Symmetrical, normal in size, without palpable masses or nodularity. Respiratory  Auscultation:  Clear without wheezing or rhonchi Cardiovascular  Auscultation:  Regular rate, without rubs, murmurs or gallops  Edema/varicosities:  Not grossly evident Abdominal  Soft,nontender, without masses, guarding or rebound.  Liver/spleen:  No organomegaly noted  Hernia:  None appreciated  Skin  Inspection:  Grossly normal   Breasts: Examined lying and sitting.     Right: Without masses, retractions, discharge or axillary adenopathy.     Left: Without masses, retractions, discharge or axillary adenopathy. Gentitourinary   Inguinal/mons:  Normal without inguinal adenopathy  External genitalia:  Normal  BUS/Urethra/Skene's glands:  Normal  Vagina:  Normal  Cervix:  Normal. Mirena IUD string observed.  Uterus:  Anteverted, normal in size, shape and contour.  Midline and mobile  Adnexa/parametria:      Rt: Without masses or tenderness.   Lt: Without masses or tenderness.  Anus and perineum: Normal  Digital rectal exam: Normal sphincter tone without palpated masses or tenderness  Assessment/Plan:  32 y.o. WF G1, P1 for annual exam with occasional complaints of painful intercourse.  10/2014 Mirena IUD rare bleeding Dysparenia  Plan: Encouraged over-the-counter lubricants with sex. SBE, exercise,  low carb/calorie diet, and MVI daily encouraged..    Pap, normal Pap 2016.  Normal CMP at hospital visit 05/2017.Marland Kitchen   Harrington Challenger [facility], 9:33 AM 07/12/2017

## 2017-07-12 NOTE — Patient Instructions (Signed)

## 2017-07-13 LAB — PAP IG W/ RFLX HPV ASCU

## 2017-12-29 ENCOUNTER — Ambulatory Visit (INDEPENDENT_AMBULATORY_CARE_PROVIDER_SITE_OTHER): Payer: Self-pay | Admitting: Obstetrics & Gynecology

## 2017-12-29 ENCOUNTER — Telehealth: Payer: Self-pay | Admitting: *Deleted

## 2017-12-29 ENCOUNTER — Encounter: Payer: Self-pay | Admitting: Obstetrics & Gynecology

## 2017-12-29 VITALS — BP 126/82

## 2017-12-29 DIAGNOSIS — N644 Mastodynia: Secondary | ICD-10-CM

## 2017-12-29 DIAGNOSIS — N63 Unspecified lump in unspecified breast: Secondary | ICD-10-CM

## 2017-12-29 LAB — PREGNANCY, URINE: Preg Test, Ur: NEGATIVE

## 2017-12-29 NOTE — Patient Instructions (Signed)
1. Painful lumpy breasts Most probably bilateral fibrocystic breast disease.  Will rule out other breast pathology with bilateral diagnostic mammogram and ultrasound.  Patient will stop new supplements and vitamins that she started a month ago.  Rule out pregnancy with a urine pregnancy test, UPT negative.  Possibly just a stronger ovulation this month in spite of the Mirena IUD.  No family history of breast cancer. - Pregnancy, urine:  Negative  Other orders - UNABLE TO FIND; MANGO LIFE DIETARY SUPPLEMENT - TAKES DAILY  Iris, it was a pleasure meeting you today!

## 2017-12-29 NOTE — Progress Notes (Signed)
    Jill Ramirez Aug 29, 1985 [id number]        32 y.o.  G1P1001   RP:  Bilateral breast tenderness/pain and widespread small lumps x 2 weeks  HPI: Well on Mirena IUD x 10/2014.  No menses.  No pelvic pain.  C/O bilateral breast tenderness/pain and widespread small lumps in breasts x 2 weeks.  No nipple discharge.  No redness of skin.  No fever.  More H/As x a week or so as well.  No Fam h/o Breast Ca.   OB History  Gravida Para Term Preterm AB Living  1 1 1  0 0 1  SAB TAB Ectopic Multiple Live Births  0 0 0   1    # Outcome Date GA Lbr Len/2nd Weight Sex Delivery Anes PTL Lv  1 Term 03/25/05 [redacted]w[redacted]d  7 lb (3.175 kg) F Vag-Spont None  LIV    Past medical history,surgical history, problem list, medications, allergies, family history and social history were all reviewed and documented in the EPIC chart.   Directed ROS with pertinent positives and negatives documented in the history of present illness/assessment and plan.  Exam:  Vitals:   12/29/17 1300  BP: 126/82   General appearance:  Normal  Breast exam: Bilateral scattered small fibrous or cystic lesions on both breasts which are very tender.  No mass felt.  Skin normal.  No nipple discharge.  No axillary lymph nodes felt bilaterally.  UPT negative   Assessment/Plan:  32 y.o. G1P1001   1. Painful lumpy breasts Most probably bilateral fibrocystic breast disease.  Will rule out other breast pathology with bilateral diagnostic mammogram and ultrasound.  Patient will stop new supplements and vitamins that she started a month ago.  Rule out pregnancy with a urine pregnancy test, UPT negative.  Possibly just a stronger ovulation this month in spite of the Mirena IUD.  No family history of breast cancer. - Pregnancy, urine  Other orders - UNABLE TO FIND; MANGO LIFE DIETARY SUPPLEMENT - TAKES DAILY  Counseling on above issues and coordination of care more than 50% for 25 minutes.  Genia Del MD, 1:13 PM  12/29/2017

## 2017-12-29 NOTE — Telephone Encounter (Signed)
-----   Message from Genia Del, MD sent at 12/29/2017  1:30 PM EST ----- Regarding: Bilateral Dx mammo/US Probable bilateral Fibrocystic breast disease with breast pain.

## 2017-12-29 NOTE — Telephone Encounter (Signed)
Patient is uninsured sent staff message to St[facility] to schedule with [facility].

## 2018-01-05 ENCOUNTER — Other Ambulatory Visit (HOSPITAL_COMMUNITY): Payer: Self-pay | Admitting: *Deleted

## 2018-01-05 DIAGNOSIS — N63 Unspecified lump in unspecified breast: Secondary | ICD-10-CM

## 2018-01-05 NOTE — Telephone Encounter (Signed)
Patient scheduled on 01/16/18 at 9:15am for the below

## 2018-01-16 ENCOUNTER — Ambulatory Visit
Admission: RE | Admit: 2018-01-16 | Discharge: 2018-01-16 | Disposition: A | Discharge status: Home / Self Care | Payer: No Typology Code available for payment source | Source: Ambulatory Visit | Attending: Obstetrics and Gynecology | Admitting: Obstetrics and Gynecology

## 2018-01-16 ENCOUNTER — Encounter (HOSPITAL_COMMUNITY): Payer: Self-pay

## 2018-01-16 ENCOUNTER — Ambulatory Visit (HOSPITAL_COMMUNITY)
Admission: RE | Admit: 2018-01-16 | Discharge: 2018-01-16 | Disposition: A | Discharge status: Home / Self Care | Payer: Self-pay | Source: Ambulatory Visit | Attending: Obstetrics and Gynecology | Admitting: Obstetrics and Gynecology

## 2018-01-16 VITALS — BP 114/76 | Wt 152.0 lb

## 2018-01-16 DIAGNOSIS — N6321 Unspecified lump in the left breast, upper outer quadrant: Secondary | ICD-10-CM

## 2018-01-16 DIAGNOSIS — N63 Unspecified lump in unspecified breast: Secondary | ICD-10-CM

## 2018-01-16 DIAGNOSIS — Z1239 Encounter for other screening for malignant neoplasm of breast: Secondary | ICD-10-CM

## 2018-01-16 DIAGNOSIS — N6315 Unspecified lump in the right breast, overlapping quadrants: Secondary | ICD-10-CM

## 2018-01-16 DIAGNOSIS — N6310 Unspecified lump in the right breast, unspecified quadrant: Secondary | ICD-10-CM

## 2018-01-16 HISTORY — DX: Migraine, unspecified, not intractable, without status migrainosus: G43.909

## 2018-01-16 NOTE — Progress Notes (Signed)
Complaints of bilateral breast lumps since 12/28/2017 and pain. Patient states the pain is constant and has decreased. Patient rates the pain at a 2-3 out of 10. Patient stated when she first noticed the lumps that her breast were hot to the touch.  Pap Smear: Pap smear not completed today. [facility] and normal. Per patient has no history of an abnormal Pap smear. Last Pap smear result is in Epic.  Physical exam: Breasts Breasts symmetrical. No skin abnormalities bilateral breasts. No nipple retraction bilateral breasts. No nipple discharge bilateral breasts. No lymphadenopathy. Palpated a pea sized lump within the left breast at 2 o'clock 7 cm from the nipple. Palpated a lump within the right breast at 6 o'clock 4 cm from the nipple. Complaints of bilateral diffuse breast tenderness on exam. [facility] for a diagnostic mamm[facility] and bilateral breast ultrasound. Appointment scheduled for Tuesday, January 16, 2018 at 1130.        Pelvic/Bimanual No Pap smear completed today since last Pap smear was 07/12/2017. Pap smear not indicated per BCCCP guidelines.   Smoking History: Patient has never smoked.  Patient Navigation: Patient education provided. Access to services provided for patient through BCCCP program.   Breast and Cervical Cancer Risk Assessment: Patient has no family history of breast cancer, known genetic mutations, or radiation treatment to the chest before age 33. Patient has no history of cervical dysplasia, immunocompromised, or DES exposure in-utero. Breast Cancer risk assessment completed. No breast cancer risk calculated due to patient is less than 33 years old.

## 2018-01-16 NOTE — Patient Instructions (Signed)
Explained breast self awareness with Jill Ramirez. Patient did not need a Pap smear today due to last Pap smear was 07/12/2017. Let her know BCCCP will cover Pap smears every 3 years unless has a history of abnormal Pap smears. [facility] for a diagnostic mammogram and bilateral breast ultrasound. Appointment scheduled for Tuesday, January 16, 2018 at 1130. Patient aware of appointment and will be there. Jill Ramirez verbalized understanding.  [name], [name], RN 2:57 PM

## 2018-01-22 ENCOUNTER — Encounter (HOSPITAL_COMMUNITY): Payer: Self-pay | Admitting: *Deleted

## 2018-07-17 ENCOUNTER — Other Ambulatory Visit: Payer: Self-pay

## 2018-07-18 ENCOUNTER — Encounter: Payer: Self-pay | Admitting: Women's Health

## 2018-07-18 ENCOUNTER — Ambulatory Visit (INDEPENDENT_AMBULATORY_CARE_PROVIDER_SITE_OTHER): Payer: Self-pay | Admitting: Women's Health

## 2018-07-18 VITALS — BP 120/82 | Ht 64.0 in | Wt 151.0 lb

## 2018-07-18 DIAGNOSIS — Z01419 Encounter for gynecological examination (general) (routine) without abnormal findings: Secondary | ICD-10-CM

## 2018-07-18 MED ORDER — SUMATRIPTAN SUCCINATE 25 MG PO TABS: 25.0000 mg | ORAL_TABLET | ORAL | 1 refills | Status: DC | PRN

## 2018-07-18 MED ORDER — FLUCONAZOLE 150 MG PO TABS: 150.0000 mg | ORAL_TABLET | Freq: Once | ORAL | 1 refills | Status: AC

## 2018-07-18 NOTE — Progress Notes (Signed)
Jill Ramirez 05-10-1985 [id number]    History:    Presents for annual exam.  10/2014 Mirena IUD, amenorrheic.  History of frequent headaches, migraines and asthma.  Normal Pap history.  Had a questionable breast lump 12/2017 had a normal mammogram with ultrasound.  Past medical history, past surgical history, family history and social history were all reviewed and documented in the EPIC chart.  Works for a [employer].  Daughter is 39, has had Gardasil, doing well, ice skater and attends [place].  From [place].  Questions if mother has diabetes unsure.  ROS:  A ROS was performed and pertinent positives and negatives are included.  Exam:  Vitals:   07/18/18 0907  BP: 120/82  Weight: 151 lb (68.5 kg)  Height: 5\' 4"  (1.626 m)   Body mass index is 25.92 kg/m.   General appearance:  Normal Thyroid:  Symmetrical, normal in size, without palpable masses or nodularity. Respiratory  Auscultation:  Clear without wheezing or rhonchi Cardiovascular  Auscultation:  Regular rate, without rubs, murmurs or gallops  Edema/varicosities:  Not grossly evident Abdominal  Soft,nontender, without masses, guarding or rebound.  Liver/spleen:  No organomegaly noted  Hernia:  None appreciated  Skin  Inspection:  Grossly normal   Breasts: Examined lying and sitting.     Right: Without masses, retractions, discharge or axillary adenopathy.     Left: Without masses, retractions, discharge or axillary adenopathy. Gentitourinary   Inguinal/mons:  Normal without inguinal adenopathy  External genitalia:  Normal  BUS/Urethra/Skene's glands:  Normal  Vagina: Erythema with white discharge wet prep positive for yeast  Cervix:  Normal IUD strings visible  Uterus:  normal in size, shape and contour.  Midline and mobile  Adnexa/parametria:     Rt: Without masses or tenderness.   Lt: Without masses or tenderness.  Anus and perineum: Normal  Digital rectal exam: Normal sphincter tone  without palpated masses or tenderness  Assessment/Plan:  33 y.o. M HF G1, P1 for annual exam with complaint of vaginal irritation and fatigue.  10/2014 Mirena IUD amenorrhea Migraine/asthma Yeast vaginitis  Plan: Aware Mirena IUD is good for 5 years.  Diflucan 150 p.o. x1 dose repeat in 3 days if needed prescription given.  SBEs, annual screening mammogram at 40.  Imitrex 25 mg as needed no more than type II tablets in 24-hour period.  Reviewed importance of regular exercise, calcium rich foods, vitamin D 1000 daily encouraged.  CBC, glucose, Pap normal with negative HR HPV 2019, new screening guidelines reviewed.   Harrington Challenger [facility], 9:16 AM 07/18/2018

## 2018-07-18 NOTE — Patient Instructions (Addendum)
[place] (Health Maintenance, Female) Un estilo de vida saludable y los cuidados preventivos pueden favorecer considerablemente a la salud y Musician. Pregunte a su mdico cul es el cronograma de exmenes peridicos apropiado para usted. Esta es una buena oportunidad para consultarlo sobre cmo prevenir enfermedades y [place] sano. Adems de los controles, hay muchas otras cosas que puede hacer usted mismo. Los expertos han realizado numerosas investigaciones [employer] cambios en el estilo de vida y las medidas de prevencin que, [place], lo ayudarn a mantenerse sano. Solicite a su mdico ms informacin. EL PESO Y LA DIETA Consuma una dieta saludable.  Asegrese de [employer] Stores verduras, frutas, productos lcteos de bajo contenido de [place] y Advertising account planner.  No consuma muchos alimentos de alto contenido de grasas slidas, azcares agregados o sal.  Realice actividad fsica con regularidad. Esta es una de las prcticas ms importantes que puede hacer por su salud. ? La Delorise Shiner de los adultos deben hacer ejercicio durante al menos 124mnutos por semana. El ejercicio debe aumentar la frecuencia cardaca y pActorla transpiracin (ejercicio de i[place]. ? La mayora de los adultos tambin deben hacer ejercicios de elongacin al mToysRusveces a la semana. Agregue esto al su plan de ejercicio de intensidad moderada. Mantenga un peso saludable.  El ndice de masa corporal ([facility] es una medida que puede utilizarse para identificar posibles problemas de pEast Uniontown Proporciona una estimacin de la grasa corporal basndose en el peso y la altura. Su mdico puede ayudarle a dRadiation protection practitionerISouth Endy a lScientist, forensico mTheatre managerun peso saludable.  Para las mujeres de 20aos o ms: ? Un IJohn R. Oishei Children'S Hospitalmenor de 18,5 se considera bajo peso. ? Un ICumberland County Hospitalentre 18,5 y 24,9 es normal. ? Un IPelham Medical Centerentre 25 y 29,9 se considera sobrepeso. ? Un IMC de 30 o ms se considera  obesidad. Observe los niveles de colesterol y lpidos en la sangre.  Debe comenzar a rEnglish as a second language teacherde lpidos y cResearch officer, trade unionen la sangre a los 20aos y luego repetirlos cada 516aos  Es posible que nAutomotive engineerlos niveles de colesterol con mayor frecuencia si: ? Sus niveles de lpidos y colesterol son altos. ? Es mayor de 527CWC ? Presenta un alto riesgo de padecer enfermedades cardacas. DETECCIN DE CNCER Cncer de pulmn  Se recomienda realizar exmenes de deteccin de cncer de pulmn a personas adultas entre 574y 892aos que estn en riesgo de dHorticulturist, commercialde pulmn por sus antecedentes de consumo de tabaco.  Se recomienda una tomografa computarizada de baja dosis de los pulmones todos los aos a las personas que: ? Fuman actualmente. ? Hayan dejado el hbito en algn momento en los ltimos 15aos. ? Hayan fumado durante 30aos un paquete diario. Un paquete-ao equivale a fumar un promedio de un paquete de cigarrillos diario durante un ao.  Los exmenes de deteccin anuales deben continuar hasta que hayan pasado 15aos desde que dej de fumar.  Ya no debern realizarse si tiene un problema de salud que le impida recibir tratamiento para eScience writerde pulmn. Cncer de mama  Practique la autoconciencia de la mama. Esto significa reconocer la apariencia normal de sus mamas y cmo las siente.  Tambin significa realizar autoexmenes regulares de lJohnson & Johnson Informe a su mdico sobre cualquier cambio, sin importar cun pequeo sea.  Si tiene entre 20 y 363aos, un mdico debe realizarle un examen clnico de las mamas como parte del examen regular de s[place] cada 1 a  3aos.  Si tiene 40aos o ms, debe realizarse un examen clnico de las mamas todos los aos. Tambin considere realizarse una radiografa de las mamas (mamografa) todos los aos.  Si tiene antecedentes familiares de cncer de mama, hable con su mdico para someterse a un estudio gentico.  Si  tiene alto riesgo de padecer cncer de mama, hable con su mdico para someterse a una resonancia magntica y una mamografa todos los aos.  La evaluacin del gen del cncer de mama (BRCA) se recomienda a mujeres que tengan familiares con cnceres relacionados con el BRCA. Los cnceres relacionados con el BRCA incluyen los siguientes: ? Mama. ? Ovario. ? Trompas. ? Cnceres de peritoneo.  Los resultados de la evaluacin determinarn la necesidad de asesoramiento gentico y de anlisis de BRCA1 y BRCA2. Cncer de cuello del tero El mdico puede recomendarle que se haga pruebas peridicas de deteccin de cncer de los rganos de la pelvis (ovarios, tero y vagina). Estas pruebas incluyen un examen plvico, que abarca controlar si se produjeron cambios microscpicos en la superficie del cuello del tero (prueba de Papanicolaou). Pueden recomendarle que se haga estas pruebas cada 3aos, a partir de los 21aos.  A las mujeres que tienen entre 30 y 65aos, los mdicos pueden recomendarles que se sometan a exmenes plvicos y pruebas de Papanicolaou cada 3aos, o a la prueba de Papanicolaou y el examen plvico en combinacin con estudios de deteccin del virus del papiloma humano (VPH) cada 5aos. Algunos tipos de VPH aumentan el riesgo de padecer cncer de cuello del tero. La prueba para la deteccin del VPH tambin puede realizarse a mujeres de cualquier edad cuyos resultados de la prueba de Papanicolaou no sean claros.  Es posible que otros mdicos no recomienden exmenes de deteccin a mujeres no embarazadas que se consideran sujetos de bajo riesgo de padecer cncer de pelvis y que no tienen sntomas. Pregntele al mdico si un examen plvico de deteccin es adecuado para usted.  Si ha recibido un tratamiento para el cncer cervical o una enfermedad que podra causar cncer, necesitar realizarse una prueba de Papanicolaou y controles durante al menos 20 aos de concluido el tratamiento. Si no se  ha hecho el Papanicolaou con regularidad, debern volver a evaluarse los factores de riesgo (como tener un nuevo compaero sexual), para determinar si debe realizarse los estudios nuevamente. Algunas mujeres sufren problemas mdicos que aumentan la probabilidad de contraer cncer de cuello del tero. En estos casos, el mdico podr indicar que se realicen controles y pruebas de Papanicolaou con ms frecuencia. Cncer colorrectal  Este tipo de cncer puede detectarse y a menudo prevenirse.  Por lo general, los estudios de rutina se deben comenzar a hacer a partir de los 50 aos y hasta los 75 aos.  Sin embargo, el mdico podr aconsejarle que lo haga antes, si tiene factores de riesgo para el cncer de colon.  Tambin puede recomendarle que use un kit de prueba para hallar sangre oculta en la materia fecal.  Es posible que se use una pequea cmara en el extremo de un tubo para examinar directamente el colon (sigmoidoscopia o colonoscopia) a fin de detectar formas tempranas de cncer colorrectal.  Los exmenes de rutina generalmente comienzan a los 50aos.  El examen directo del colon se debe repetir cada 5 a 10aos hasta los 75aos. Sin embargo, es posible que se realicen exmenes con mayor frecuencia, si se detectan formas tempranas de plipos precancerosos o pequeos bultos. Cncer de piel  Revise la piel   de la cabeza a los pies con regularidad.  Informe a su mdico si aparecen nuevos lunares o los que tiene se modifican, especialmente en su forma y color.  Tambin notifique al mdico si tiene un lunar que es ms grande que el tamao de una goma de lpiz.  Siempre use pantalla solar. Aplique pantalla solar de manera libre y repetida a lo largo del da.  Protjase usando mangas y pantalones largos, un sombrero de ala ancha y gafas para el sol, siempre que se encuentre en el exterior. ENFERMEDADES CARDACAS, DIABETES E HIPERTENSIN ARTERIAL  La hipertensin arterial causa  enfermedades cardacas y aumenta el riesgo de ictus. La hipertensin arterial es ms probable en los siguientes casos: ? Las personas que tienen la presin arterial en el extremo del rango normal (100-139/85-89 mm Hg). ? Las personas con sobrepeso u obesidad. ? Las personas afroamericanas.  Si usted tiene entre 18 y 39 aos, debe medirse la presin arterial cada 3 a 5 aos. Si usted tiene 40 aos o ms, debe medirse la presin arterial todos los aos. Debe medirse la presin arterial dos veces: una vez cuando est en un hospital o una clnica y la otra vez cuando est en otro sitio. Registre el promedio de las dos mediciones. Para controlar su presin arterial cuando no est en un hospital o una clnica, puede usar lo siguiente: ? Una mquina automtica para medir la presin arterial en una farmacia. ? Un monitor para medir la presin arterial en el hogar.  Si tiene entre 55 y 79 aos, consulte a su mdico si debe tomar aspirina para prevenir el ictus.  Realcese exmenes de deteccin de la diabetes con regularidad. Esto incluye la toma de una muestra de sangre para controlar el nivel de azcar en la sangre durante el ayuno. ? Si tiene un peso normal y un bajo riesgo de padecer diabetes, realcese este anlisis cada tres aos despus de los 45aos. ? Si tiene sobrepeso y un alto riesgo de padecer diabetes, considere someterse a este anlisis antes o con mayor frecuencia. PREVENCIN DE INFECCIONES HepatitisB  Si tiene un riesgo ms alto de contraer hepatitis B, debe someterse a un examen de deteccin de este virus. Se considera que tiene un alto riesgo de contraer hepatitis B si: ? Naci en un pas donde la hepatitis B es frecuente. Pregntele a su mdico qu pases son considerados de alto riesgo. ? Sus padres nacieron en un pas de alto riesgo y usted no recibi una vacuna que lo proteja contra la hepatitis B (vacuna contra la hepatitis B). ? Tiene VIH o sida. ? Usa agujas para inyectarse  drogas. ? Vive con alguien que tiene hepatitis B. ? Ha tenido sexo con alguien que tiene hepatitis B. ? Recibe tratamiento de hemodilisis. ? Toma ciertos medicamentos para el cncer, trasplante de rganos y afecciones autoinmunitarias. Hepatitis C  Se recomienda un anlisis de sangre para: ? Todos los que nacieron entre 1945 y 1965. ? Todas las personas que tengan un riesgo de haber contrado hepatitis C. Enfermedades de transmisin sexual (ETS).  Debe realizarse pruebas de deteccin de enfermedades de transmisin sexual (ETS), incluidas gonorrea y clamidia si: ? Es sexualmente activo y es menor de 24aos. ? Es mayor de 24aos, y el mdico le informa que corre riesgo de tener este tipo de infecciones. ? La actividad sexual ha cambiado desde que le hicieron la ltima prueba de deteccin y tiene un riesgo mayor de tener clamidia o gonorrea. Pregntele al mdico si usted   tiene riesgo.  Si no tiene el VIH, pero corre riesgo de infectarse por el virus, se recomienda tomar diariamente un medicamento recetado para evitar la infeccin. Esto se conoce como profilaxis previa a la exposicin. Se considera que est en riesgo si: ? Es [place] sexualmente y no [place] preservativos habitualmente o no conoce el estado del VIH de sus Advertising copywriter. ? Se inyecta drogas. ? Es [place] sexualmente con Ardelia Mems pareja que tiene VIH. Consulte a su mdico para saber si tiene un alto riesgo de infectarse por el VIH. Si opta por comenzar la profilaxis previa a la exposicin, primero debe realizarse anlisis de deteccin del VIH. Luego, le harn anlisis cada 33mses mientras est tomando los medicamentos para la profilaxis previa a la exposicin. EFleming [facility] Hospital Si es premenopusica y puede quedar eFair Play solicite a su mdico asesoramiento previo a la concepcin.  Si puede quedar embarazada, tome 400 a [id number](mcg) de cido fAnheuser-Busch  Si desea evitar el embarazo, hable con su mdico sobre el  control de la natalidad (anticoncepcin). OSTEOPOROSIS Y MENOPAUSIA  La osteoporosis es una enfermedad en la que los huesos pierden los minerales y la fuerza por el avance de la edad. El resultado pueden ser fracturas graves en los hPearl City El riesgo de osteoporosis puede identificarse con uArdelia Memsprueba de densidad sea.  Si tiene 65aos o ms, o si est en riesgo de sufrir osteoporosis y fracturas, pregunte a su mdico si debe someterse a exmenes.  Consulte a su mdico si debe tomar un suplemento de calcio o de vitamina D para reducir el riesgo de osteoporosis.  La menopausia puede presentar ciertos sntomas fsicos y rGaffer  La terapia de reemplazo hormonal puede reducir algunos de estos sntomas y rGaffer Consulte a su mdico para saber si la terapia de reemplazo hormonal es conveniente para usted. INSTRUCCIONES PARA EL CUIDADO EN EL HOGAR  Realcese los estudios de rutina de la salud, dentales y de lPublic librarian  MPark [place]  No consuma ningn producto que contenga tabaco, lo que incluye cigarrillos, tabaco de mHigher education careers advisero cPsychologist, sport and exercise  Si est embarazada, no beba alcohol.  Si est amamantando, reduzca el consumo de alcohol y la frecuencia con la que consume.  Si es mujer y no est embarazada limite el consumo de alcohol a no ms de 1 medida por da. Una medida equivale a 12onzas de cerveza, 5onzas de vino o 1onzas de bebidas alcohlicas de alta graduacin.  No consuma drogas.  No comparta agujas.  Solicite ayuda a su mdico si necesita apoyo o informacin para abandonar las drogas.  Informe a su mdico si a menudo se siente deprimido.  Notifique a su mdico si alguna vez ha sido vctima de abuso o si no se siente seguro en su hogar. Esta informacin no tiene cMarine scientistel consejo del mdico. Asegrese de hacerle al mdico cualquier pregunta que tenga. Document Released: 01/27/2011 Document Revised: 02/28/2014 Document Reviewed:  11/11/2014 Elsevier Interactive Patient Education  2019 Elsevier Inc.  Candidiasis vaginal en los adultos Vaginal Yeast infection, Adult  La infeccin mictica vaginal es una afeccin que causa secrecin vaginal y tGarment/textile technologist hinchazn y enrojecimiento (inflamacin) de la vagina. Esta es una enfermedad frecuente. Algunas mujeres contraen esta infeccin con frecuencia. Cules son las causas? La causa de la infeccin es un cambio en el equilibrio normal de los hongos (cndida) y las bacterias que viven en la vagina. Esta alteracin deriva en el crecimiento excesivo de los hongos, lo  que causa la inflamacin. Qu incrementa el riesgo? La afeccin es ms probable en las mujeres que tienen estas caractersticas:  Toman antibiticos.  Tienen diabetes.  Toman anticonceptivos orales.  Estn embarazadas.  Se hacen duchas vaginales con frecuencia.  Tienen debilitado el sistema de defensa del organismo (sistema inmunitario).  Han estado tomando medicamentos con corticoesteroides durante [employer].  Usan ropa ajustada con frecuencia. Cules son los signos o los sntomas? Los sntomas de esta afeccin incluyen los siguientes:  Secrecin vaginal blanca, cremosa y espesa.  Hinchazn, picazn, enrojecimiento e irritacin de la vagina. Los labios de la vagina (vulva) tambin se pueden infectar.  Dolor o ardor al Garment/textile technologist.  [place]. Cmo se diagnostica? Esta afeccin se diagnostica en funcin de lo siguiente:  Sus antecedentes mdicos.  Un examen fsico.  Examen plvico. El mdico examinar una muestra de la secrecin vaginal con un microscopio. Probablemente el mdico enve esta muestra al laboratorio para analizarla y confirmar el diagnstico. Cmo se trata? Esta afeccin se trata con medicamentos. Los Dynegy pueden ser recetados o de venta libre. Podrn indicarle que use uno o ms de lo siguiente:  Medicamentos que se toman por boca  (orales).  Medicamentos que se aplican como una crema (tpicos).  Medicamentos que se colocan directamente en la vagina (vulos vaginales). Siga estas indicaciones en su casa:  Estilo de vida  No tenga relaciones sexuales hasta que el mdico lo autorice. Comunique a su compaero sexual que tiene una infeccin por hongos. Esa persona debera visitar a su mdico y preguntarle si debera recibir tratamiento tambin.  No use ropa ajustada, como pantimedias o pantalones ajustados.  Use ropa interior de algodn, que permite el paso del aire. Indicaciones generales  Tome o aplquese los medicamentos de venta libre y los recetados solamente como se lo haya indicado el mdico.  Consuma ms yogur. Esto puede ayudar a Technical brewer de la candidiasis.  No use tampones hasta que el mdico la autorice.  Intente darse un bao de asiento para [employer]. Se trata de un bao de agua tibia que se toma mientras se est sentado. El agua solo debe Systems analyst las caderas y cubrir las nalgas. Hgalo 3o 4veces al da o como se lo haya indicado el mdico.  No se haga duchas vaginales.  Si tiene diabetes, mantenga bajo control los niveles de Dispensing optician.  Concurra a todas las visitas de control como se lo haya indicado el mdico. Esto es importante. Comunquese con un mdico si:  Tiene fiebre.  Los sntomas desaparecen y luego vuelven a Arts administrator.  Los sntomas no mejoran con Dispensing optician.  Sus sntomas empeoran.  Aparecen nuevos sntomas.  Aparecen ampollas alrededor o adentro de la vagina.  Le sale sangre de la vagina y no est menstruando.  Siente dolor en el abdomen. Resumen  La infeccin mictica vaginal es una afeccin que causa secrecin y Garment/textile technologist, hinchazn y enrojecimiento (inflamacin) de la vagina.  Esta afeccin se trata con medicamentos. Los Dynegy pueden ser recetados o de venta libre.  Tome o aplquese los medicamentos de venta  libre y los recetados solamente como se lo haya indicado el mdico.  No se haga duchas vaginales. No tenga relaciones sexuales ni use tampones hasta que el mdico la autorice.  Comunquese con un mdico si los sntomas no mejoran con el tratamiento o si los sntomas desaparecen y [employer]. Esta informacin no tiene Marine scientist el consejo del mdico. Asegrese de  hacerle al mdico cualquier pregunta que tenga. Document Released: 11/17/2004 Document Revised: 08/17/2017 Document Reviewed: 08/17/2017 Elsevier Interactive Patient Education  2019 [employer].

## 2018-07-19 LAB — CBC WITH DIFFERENTIAL/PLATELET
Absolute Monocytes: 656 cells/uL (ref 200–950)
Basophils Absolute: 112 cells/uL (ref 0–200)
Basophils Relative: 1.4 %
Eosinophils Absolute: 504 cells/uL — ABNORMAL HIGH (ref 15–500)
Eosinophils Relative: 6.3 %
HCT: 41.4 % (ref 35.0–45.0)
Hemoglobin: 13.7 g/dL (ref 11.7–15.5)
Lymphs Abs: 1968 cells/uL (ref 850–3900)
MCH: 29.2 pg (ref 27.0–33.0)
MCHC: 33.1 g/dL (ref 32.0–36.0)
MCV: 88.3 fL (ref 80.0–100.0)
MPV: 10.3 fL (ref 7.5–12.5)
Monocytes Relative: 8.2 %
Neutro Abs: 4760 cells/uL (ref 1500–7800)
Neutrophils Relative %: 59.5 %
Platelets: 317 10*3/uL (ref 140–400)
RBC: 4.69 10*6/uL (ref 3.80–5.10)
RDW: 12.9 % (ref 11.0–15.0)
Total Lymphocyte: 24.6 %
WBC: 8 10*3/uL (ref 3.8–10.8)

## 2018-07-19 LAB — GLUCOSE, RANDOM: Glucose, Bld: 95 mg/dL (ref 65–99)

## 2019-02-28 ENCOUNTER — Other Ambulatory Visit: Payer: Self-pay

## 2019-03-01 ENCOUNTER — Ambulatory Visit (INDEPENDENT_AMBULATORY_CARE_PROVIDER_SITE_OTHER): Payer: Self-pay | Admitting: Obstetrics & Gynecology

## 2019-03-01 ENCOUNTER — Encounter: Payer: Self-pay | Admitting: Obstetrics & Gynecology

## 2019-03-01 VITALS — BP 116/74

## 2019-03-01 DIAGNOSIS — N9089 Other specified noninflammatory disorders of vulva and perineum: Secondary | ICD-10-CM

## 2019-03-01 NOTE — Progress Notes (Signed)
    Jill Ramirez 01/29/1986 [id number]        34 y.o.  G1P1L1 Married  RP: Vulvar lesion x 3 days  HPI: Complains of burning vulvar lesions for 3 days.  Patient reports having a physical with ruptured and let transparent fluid evacuate.  No previous genital herpes.  Possible history of cold sores around the mouth.  No pelvic pain.  No abnormal bleeding.  Declines full STD screening at this time.   OB History  Gravida Para Term Preterm AB Living  1 1 1  0 0 1  SAB TAB Ectopic Multiple Live Births  0 0 0   1    # Outcome Date GA Lbr Len/2nd Weight Sex Delivery Anes PTL Lv  1 Term 03/25/05 [redacted]w[redacted]d  7 lb (3.175 kg) F Vag-Spont None  LIV    Past medical history,surgical history, problem list, medications, allergies, family history and social history were all reviewed and documented in the EPIC chart.   Directed ROS with pertinent positives and negatives documented in the history of present illness/assessment and plan.  Exam:  Vitals:   03/01/19 1055  BP: 116/74   General appearance:  Normal  Gynecologic exam: Vulva:  Small wet ulcers at lower Rt labia minora.  HSV culture done.   Assessment/Plan:  34 y.o. G1P1001   1. Vulvar lesion Very small vulvar ulcers of the right lower labia minora, per patient had a physical that ruptured with clear fluid, burning for 3 days.  Rule out genital herpes.  Possible contact dermatitis.  Information and precautions reviewed with patient.  Management per HSV culture results.  Patient declined other STD screening at this time. - Herpes simplex virus culture  Other orders - Vitamin D, Ergocalciferol, (DRISDOL) 1.25 MG (50000 UT) CAPS capsule; Take 50,000 Units by mouth every 7 (seven) days. - Calcium Carb-Cholecalciferol (CALCIUM 1000 + D PO); Take by mouth. - VITAMIN K PO; Take by mouth. - Menaquinone-7 (VITAMIN K2 PO); Take by mouth.  Counseling on above issues and coordination of care more than 50% for 25 minutes.  20 MD, 11:27 AM 03/01/2019

## 2019-03-03 ENCOUNTER — Encounter: Payer: Self-pay | Admitting: Obstetrics & Gynecology

## 2019-03-03 NOTE — Patient Instructions (Signed)
1. Vulvar lesion Very small vulvar ulcers of the right lower labia minora, per patient had a physical that ruptured with clear fluid, burning for 3 days.  Rule out genital herpes.  Possible contact dermatitis.  Information and precautions reviewed with patient.  Management per HSV culture results.  Patient declined other STD screening at this time. - Herpes simplex virus culture  Other orders - Vitamin D, Ergocalciferol, (DRISDOL) 1.25 MG (50000 UT) CAPS capsule; Take 50,000 Units by mouth every 7 (seven) days. - Calcium Carb-Cholecalciferol (CALCIUM 1000 + D PO); Take by mouth. - VITAMIN K PO; Take by mouth. - Menaquinone-7 (VITAMIN K2 PO); Take by mouth.  Jill Ramirez, it was a pleasure seeing you today!  I will inform you of your results as soon as they are available.

## 2019-03-05 LAB — HERPES SIMPLEX VIRUS CULTURE
MICRO NUMBER:: 10022549
SPECIMEN QUALITY:: ADEQUATE

## 2019-03-06 NOTE — Telephone Encounter (Signed)
Dr.Lavoie I did explained to patient results are negative and your recommendations to use Hydroxycortisone 1% cream (OTC).

## 2019-05-03 ENCOUNTER — Encounter (HOSPITAL_COMMUNITY): Payer: Self-pay | Admitting: Emergency Medicine

## 2019-05-03 ENCOUNTER — Emergency Department (HOSPITAL_COMMUNITY)
Admission: EM | Admit: 2019-05-03 | Discharge: 2019-05-03 | Disposition: A | Discharge status: Home / Self Care | Payer: Self-pay | Attending: Emergency Medicine | Admitting: Emergency Medicine

## 2019-05-03 ENCOUNTER — Emergency Department (HOSPITAL_COMMUNITY): Payer: Self-pay

## 2019-05-03 ENCOUNTER — Ambulatory Visit: Payer: Self-pay | Attending: Internal Medicine

## 2019-05-03 DIAGNOSIS — R002 Palpitations: Secondary | ICD-10-CM

## 2019-05-03 DIAGNOSIS — R0602 Shortness of breath: Secondary | ICD-10-CM | POA: Insufficient documentation

## 2019-05-03 DIAGNOSIS — R000 Tachycardia, unspecified: Secondary | ICD-10-CM | POA: Insufficient documentation

## 2019-05-03 DIAGNOSIS — R6510 Systemic inflammatory response syndrome (SIRS) of non-infectious origin without acute organ dysfunction: Secondary | ICD-10-CM | POA: Insufficient documentation

## 2019-05-03 DIAGNOSIS — R519 Headache, unspecified: Secondary | ICD-10-CM | POA: Insufficient documentation

## 2019-05-03 DIAGNOSIS — Z23 Encounter for immunization: Secondary | ICD-10-CM

## 2019-05-03 DIAGNOSIS — R509 Fever, unspecified: Secondary | ICD-10-CM | POA: Insufficient documentation

## 2019-05-03 LAB — URINALYSIS, ROUTINE W REFLEX MICROSCOPIC
Bilirubin Urine: NEGATIVE
Glucose, UA: NEGATIVE mg/dL
Ketones, ur: 5 mg/dL — AB
Leukocytes,Ua: NEGATIVE
Nitrite: NEGATIVE
Protein, ur: NEGATIVE mg/dL
Specific Gravity, Urine: 1.004 — ABNORMAL LOW (ref 1.005–1.030)
pH: 7 (ref 5.0–8.0)

## 2019-05-03 LAB — BASIC METABOLIC PANEL
Anion gap: 9 (ref 5–15)
BUN: 11 mg/dL (ref 6–20)
CO2: 24 mmol/L (ref 22–32)
Calcium: 9.7 mg/dL (ref 8.9–10.3)
Chloride: 106 mmol/L (ref 98–111)
Creatinine, Ser: 0.77 mg/dL (ref 0.44–1.00)
GFR calc Af Amer: >60 mL/min (ref >60)
GFR calc non Af Amer: >60 mL/min (ref >60)
Glucose, Bld: 104 mg/dL — ABNORMAL HIGH (ref 70–99)
Potassium: 4 mmol/L (ref 3.5–5.1)
Sodium: 139 mmol/L (ref 135–145)

## 2019-05-03 LAB — CBC WITH DIFFERENTIAL/PLATELET
Abs Immature Granulocytes: 0.02 10*3/uL (ref 0.00–0.07)
Basophils Absolute: 0.1 10*3/uL (ref 0.0–0.1)
Basophils Relative: 1 %
Eosinophils Absolute: 0.1 10*3/uL (ref 0.0–0.5)
Eosinophils Relative: 1 %
HCT: 41.9 % (ref 36.0–46.0)
Hemoglobin: 13.9 g/dL (ref 12.0–15.0)
Immature Granulocytes: 0 %
Lymphocytes Relative: 21 %
Lymphs Abs: 1.8 10*3/uL (ref 0.7–4.0)
MCH: 29.9 pg (ref 26.0–34.0)
MCHC: 33.2 g/dL (ref 30.0–36.0)
MCV: 90.1 fL (ref 80.0–100.0)
Monocytes Absolute: 0.6 10*3/uL (ref 0.1–1.0)
Monocytes Relative: 7 %
Neutro Abs: 5.9 10*3/uL (ref 1.7–7.7)
Neutrophils Relative %: 70 %
Platelets: 318 10*3/uL (ref 150–400)
RBC: 4.65 MIL/uL (ref 3.87–5.11)
RDW: 12.7 % (ref 11.5–15.5)
WBC: 8.5 10*3/uL (ref 4.0–10.5)
nRBC: 0 % (ref 0.0–0.2)

## 2019-05-03 LAB — I-STAT BETA HCG BLOOD, ED (MC, WL, AP ONLY): I-stat hCG, quantitative: <5 m[IU]/mL (ref <5)

## 2019-05-03 LAB — LACTIC ACID, PLASMA: Lactic Acid, Venous: 1.1 mmol/L (ref 0.5–1.9)

## 2019-05-03 MED ORDER — ACETAMINOPHEN 325 MG PO TABS
650.0000 mg | ORAL_TABLET | Freq: Once | ORAL | Status: AC
  Administered 2019-05-03: 650 mg via ORAL
  Filled 2019-05-03: qty 2

## 2019-05-03 MED ORDER — KETOROLAC TROMETHAMINE 30 MG/ML IJ SOLN
30.0000 mg | Freq: Once | INTRAMUSCULAR | Status: AC
  Administered 2019-05-03: 30 mg via INTRAVENOUS
  Filled 2019-05-03: qty 1

## 2019-05-03 MED ORDER — DIPHENHYDRAMINE HCL 50 MG/ML IJ SOLN
12.5000 mg | Freq: Once | INTRAMUSCULAR | Status: AC
  Administered 2019-05-03: 12.5 mg via INTRAVENOUS
  Filled 2019-05-03: qty 1

## 2019-05-03 MED ORDER — METOCLOPRAMIDE HCL 5 MG/ML IJ SOLN
10.0000 mg | Freq: Once | INTRAMUSCULAR | Status: AC
  Administered 2019-05-03: 10 mg via INTRAVENOUS
  Filled 2019-05-03: qty 2

## 2019-05-03 MED ORDER — SODIUM CHLORIDE 0.9 % IV BOLUS
1000.0000 mL | Freq: Once | INTRAVENOUS | Status: AC
  Administered 2019-05-03: 1000 mL via INTRAVENOUS

## 2019-05-03 NOTE — ED Triage Notes (Addendum)
Per EMS, patient from vaccination clinic, reports palpitations approximately twenty minutes after receiving vaccine. Vaccine administered to L deltoid. No hives or swelling noted. Denies SOB, chest pain. Patients anxious.  20g L AC

## 2019-05-03 NOTE — Discharge Instructions (Addendum)
You were seen in the emergency department for a fever and a fast heart rate in the setting of getting your Covid vaccine.  You had blood work urinalysis chest x-ray.  Your heart rate and fever improved with some fluids and Tylenol.  There were no obvious signs of infection.  Please follow-up with your primary care doctor or return to the emergency department if any worsening symptoms.

## 2019-05-03 NOTE — ED Provider Notes (Signed)
3:25 PM-checkout from Dr. Charm Barges to evaluate for stability and consider discharge.  Patient here after developing symptoms within 20 minutes of receiving a Covid vaccine.  The reaction consisted of palpitations and fever.  Initial evaluation did not suggest anaphylaxis, or other acute compromise.  Screening labs and x-ray were normal.  EKG revealed tachycardia with nonspecific ST-T wave abnormality.   EKG Interpretation  Date/Time:  Friday May 03 2019 13:42:33 EST Ventricular Rate:  127 PR Interval:  132 QRS Duration: 82 QT Interval:  306 QTC Calculation: 444 R Axis:   88 Text Interpretation: Sinus tachycardia Right atrial enlargement Nonspecific ST and T wave abnormality Abnormal ECG No old tracing to compare Confirmed by Meridee Score [phone]) on 05/03/2019 2:35:43 PM       EKG Interpretation  Date/Time:  Friday May 03 2019 16:46:23 EST Ventricular Rate:  94 PR Interval:  132 QRS Duration: 100 QT Interval:  368 QTC Calculation: 461 R Axis:   69 Text Interpretation: Sinus rhythm Since last tracing of earlier today now in NSR and ST/T wave abnormality has resolved Confirmed by Mancel Bale [phone]) on 05/03/2019 5:00:56 PM         Patient Vitals for the past 24 hrs:  BP Temp Temp src Pulse Resp SpO2  05/03/19 1545 112/73 -- -- 84 (!) 23 100 %  05/03/19 1522 -- 98.5 F (36.9 C) Oral -- -- --  05/03/19 1515 (!) 137/102 -- -- 93 14 100 %  05/03/19 1500 127/90 -- -- 96 (!) 22 100 %  05/03/19 1445 130/86 -- -- (!) 103 (!) 21 100 %  05/03/19 1430 129/86 -- -- 94 15 100 %  05/03/19 1415 (!) 137/104 -- -- (!) 103 15 100 %  05/03/19 1331 120/89 (!) 101.3 F (38.5 C) Oral (!) 135 20 100 %    4:28 PM Reevaluation with update and discussion. After initial assessment and treatment, an updated evaluation reveals patient is comfortable, vital signs normal except respiratory rate somewhat elevated.  Findings discussed with patient all questions were answered. Mancel Bale    Medical Decision Making: Patient with unusual reaction post vaccine, fever and tachycardia.  No evidence for anaphylaxis.  Unclear if this is vaccine related or coincidental febrile illness of nonspecific nature.  Patient improved with symptomatic treatment, and had no recurrence or unusual findings on evaluation.  She does not plan on receiving her second vaccine, apparently he was scheduled for May 27, 2019.  We discussed the possibility of her discussing the situation with infectious disease doctor, prior to proceeding, and I recommended that she contact the vaccine provider, to discuss this.  Notification for hospitalization or ongoing management with prescription medication.  Jill Ramirez was evaluated in Emergency Department on 05/03/2019 for the symptoms described in the history of present illness. She was evaluated in the context of the global COVID-19 pandemic, which necessitated consideration that the patient might be at risk for infection with the SARS-CoV-2 virus that causes COVID-19. Institutional protocols and algorithms that pertain to the evaluation of patients at risk for COVID-19 are in a state of rapid change based on information released by regulatory bodies including the CDC and federal and state organizations. These policies and algorithms were followed during the patient's care in the ED.   CRITICAL CARE- No Performed by: Mancel Bale   Nursing Notes Reviewed/ Care Coordinated Applicable Imaging Reviewed Interpretation of Laboratory Data incorporated into ED treatment  The patient appears reasonably screened and/or stabilized for discharge and I doubt  any other medical condition or other [facility] requiring further screening, evaluation, or treatment in the ED at this time prior to discharge.  Plan: Home Medications-OTC symptomatic treatment of choice; Home Treatments-rest, fluids; return here if the recommended treatment, does not improve the symptoms; Recommended  follow up-PCP, as needed.  Consider seeing ID, for discussion of appropriate benefits of receiving second [employer] vaccine.       Daleen Bo, MD 05/03/19 1726

## 2019-05-03 NOTE — ED Provider Notes (Signed)
[facility]-EMERGENCY DEPT Provider Note   CSN: [id number] Arrival date & time: 05/03/19  1320     History Chief Complaint  Patient presents with  . Palpitations  . Fever    Jill Ramirez is a 34 y.o. female.  History of asthma.  She got her first Covid shot today and about 10 or 15 minutes later she became tachycardic.  Found to be febrile to 101.3.  Was afebrile.  Has shortness of breath chest pain feels her heart racing.  Trembling. No rashes. No uri symptoms other than runny nose which she states is her allergies. No nausea or vomiting, no diarrhea. Complaining also of headache  The history is provided by the patient.  Palpitations Palpitations quality:  Regular Onset quality:  Sudden Timing:  Constant Progression:  Unchanged Chronicity:  New Context comment:  Vaccine Relieved by:  None tried Worsened by:  Nothing Ineffective treatments:  None tried Associated symptoms: no back pain, no chest pain, no cough, no leg pain, no lower extremity edema, no nausea, no shortness of breath, no syncope and no vomiting   Fever Associated symptoms: headaches   Associated symptoms: no chest pain, no cough, no dysuria, no nausea, no rash, no sore throat and no vomiting        Past Medical History:  Diagnosis Date  . Allergy   . Asthma   . Migraine     Patient Active Problem List   Diagnosis Date Noted  . IUD (intrauterine device) in place 11/11/2014    History reviewed. No pertinent surgical history.   OB History    Gravida  1   Para  1   Term  1   Preterm  0   AB  0   Living  1     SAB  0   TAB  0   Ectopic  0   Multiple      Live Births  1           Family History  Problem Relation Age of Onset  . Asthma Daughter   . Heart disease Maternal Grandmother   . Diabetes Maternal Grandmother   . Hypertension Maternal Grandmother   . Hypertension Mother   . Diabetes Maternal Grandfather     Social History    Tobacco Use  . Smoking status: Never Smoker  . Smokeless tobacco: Never Used  Substance Use Topics  . Alcohol use: Yes    Alcohol/week: 3.0 standard drinks    Types: 3 Standard drinks or equivalent per week  . Drug use: Never    Home Medications Prior to Admission medications   Medication Sig Start Date End Date Taking? Authorizing Provider  Calcium Carb-Cholecalciferol (CALCIUM 1000 + D PO) Take by mouth.    [provider]  ibuprofen (ADVIL,MOTRIN) 600 MG tablet Take 1 tablet (600 mg total) by mouth every 6 (six) hours as needed. 06/06/17   Arby Barrette, MD  levonorgestrel (MIRENA) 20 MCG/24HR IUD 1 each by Intrauterine route once.    [provider]  Menaquinone-7 (VITAMIN K2 PO) Take by mouth.    [provider]  PROAIR RESPICLICK 108 [phone] Base) MCG/ACT AEPB as directed. 05/31/18   [provider]  SUMAtriptan (IMITREX) 25 MG tablet Take 1 tablet (25 mg total) by mouth every 2 (two) hours as needed for migraine. May repeat in 2 hours if headache persists or recurs. 07/18/18   Harrington Challenger, NP  Vitamin D, Ergocalciferol, (DRISDOL) 1.25 MG (50000  UT) CAPS capsule Take 50,000 Units by mouth every 7 (seven) days.    [provider]  VITAMIN K PO Take by mouth.    [provider]    Allergies    Vicodin [hydrocodone-acetaminophen] and Vicodin [hydrocodone-acetaminophen]  Review of Systems   Review of Systems  Constitutional: Positive for fever.  HENT: Positive for postnasal drip. Negative for sore throat.   Eyes: Negative for visual disturbance.  Respiratory: Negative for cough and shortness of breath.   Cardiovascular: Positive for palpitations. Negative for chest pain and syncope.  Gastrointestinal: Negative for abdominal pain, nausea and vomiting.  Genitourinary: Negative for dysuria.  Musculoskeletal: Negative for back pain.  Skin: Negative for rash.  Neurological: Positive for headaches.    Physical Exam Updated Vital  Signs BP 120/89 (BP Location: Right Arm)   Pulse (!) 135   Temp (!) 101.3 F (38.5 C) (Oral)   Resp 20   SpO2 100%   Physical Exam Vitals and nursing note reviewed.  Constitutional:      General: She is not in acute distress.    Appearance: Normal appearance. She is well-developed.  HENT:     Head: Normocephalic and atraumatic.  Eyes:     Conjunctiva/sclera: Conjunctivae normal.  Cardiovascular:     Rate and Rhythm: Regular rhythm. Tachycardia present.     Heart sounds: No murmur.  Pulmonary:     Effort: Pulmonary effort is normal. No respiratory distress.     Breath sounds: Normal breath sounds.  Abdominal:     Palpations: Abdomen is soft.     Tenderness: There is no abdominal tenderness.  Musculoskeletal:        General: No deformity or signs of injury. Normal range of motion.     Cervical back: Neck supple.  Skin:    General: Skin is warm and dry.     Capillary Refill: Capillary refill takes less than 2 seconds.     Findings: No rash.  Neurological:     General: No focal deficit present.     Mental Status: She is alert.     ED Results / Procedures / Treatments   Labs (all labs ordered are listed, but only abnormal results are displayed) Labs Reviewed  URINALYSIS, ROUTINE W REFLEX MICROSCOPIC - Abnormal; Notable for the following components:      Result Value   Color, Urine STRAW (*)    APPearance HAZY (*)    Specific Gravity, Urine 1.004 (*)    Hgb urine dipstick SMALL (*)    Ketones, ur 5 (*)    Bacteria, UA FEW (*)    All other components within normal limits  BASIC METABOLIC PANEL - Abnormal; Notable for the following components:   Glucose, Bld 104 (*)    All other components within normal limits  CULTURE, BLOOD (ROUTINE X 2)  CULTURE, BLOOD (ROUTINE X 2)  CBC WITH DIFFERENTIAL/PLATELET  LACTIC ACID, PLASMA  I-STAT BETA HCG BLOOD, ED ([facility], [facility], [facility] ONLY)    EKG EKG Interpretation  Date/Time:  Friday May 03 2019 16:46:23 EST Ventricular Rate:  94  PR Interval:  132 QRS Duration: 100 QT Interval:  368 QTC Calculation: 461 R Axis:   69 Text Interpretation: Sinus rhythm Since last tracing of earlier today now in NSR and ST/T wave abnormality has resolved Confirmed by Daleen Bo [phone]) on 05/03/2019 5:00:56 PM   Radiology DG Chest Port 1 View  Result Date: 05/03/2019 CLINICAL DATA:  Fever and cardiac palpitations EXAM: PORTABLE CHEST 1 VIEW  COMPARISON:  None. FINDINGS: Lungs are clear. Heart size and pulmonary vascularity are normal. No adenopathy. No bone lesions. IMPRESSION: No abnormality noted. Electronically Signed   By: Bretta Bang III M.D.   On: 05/03/2019 14:55    Procedures Procedures (including critical care time)  Medications Ordered in ED Medications  sodium chloride 0.9 % bolus 1,000 mL (0 mLs Intravenous Stopped 05/03/19 1518)  acetaminophen (TYLENOL) tablet 650 mg (650 mg Oral Given 05/03/19 1409)  metoCLOPramide (REGLAN) injection 10 mg (10 mg Intravenous Given 05/03/19 1529)  ketorolac (TORADOL) 30 MG/ML injection 30 mg (30 mg Intravenous Given 05/03/19 1529)  diphenhydrAMINE (BENADRYL) injection 12.5 mg (12.5 mg Intravenous Given 05/03/19 1529)    ED Course  I have reviewed the triage vital signs and the nursing notes.  Pertinent labs & imaging results that were available during my care of the patient were reviewed by me and considered in my medical decision making (see chart for details).  Clinical Course as of May 02 1712  Fri May 03, 2019  1401 DDX - SIRS, sepsis, allergic reaction   [MB]  1405 EKG shows sinus tachycardia normal intervals nonspecific ST-T changes.   [MB]  1431 Heart rate improving into the 90s.   [MB]  1457 Chest x-ray interpreted by me as no pneumothorax no gross infiltrates.   [MB]  1459 Patient states she still has a headache.  Vitals improving.  She says she does have a history of migraines.  Will give her migraine cocktail.   [MB]  1503 No meningismus clear sensorium.    [MB]  1510 Work-up showing normal white count normal lactic acid clean chest x-ray.  Still waiting on urinalysis.   [MB]    Clinical Course User Index [MB] Terrilee Files, MD   MDM Rules/Calculators/A&P                     Signed out to Dr Effie Shy with plan to followup on UA and patient response to migraine cocktail. If improved and normalized vitals, probable discharge.  Final Clinical Impression(s) / ED Diagnoses Final diagnoses:  Fever in adult  Tachycardia  Generalized headache  SIRS (systemic inflammatory response syndrome) (HCC)    Rx / DC Orders ED Discharge Orders    None       Terrilee Files, MD 05/03/19 [phone]

## 2019-05-03 NOTE — ED Notes (Signed)
XR at bedside

## 2019-05-03 NOTE — Progress Notes (Signed)
   Covid-19 Vaccination Clinic  Name:  Jill Ramirez    MRN: [id number] DOB: 11/05/1985  05/03/2019  Ms. Jill Ramirez was observed post Covid-19 immunization for 20 minutes . She was provided with Vaccine Information Sheet and instruction to access the V-Safe system.   Ms. Jill Ramirez was instructed to call 911 with any severe reactions post vaccine: Marland Kitchen Difficulty breathing  . Swelling of face and throat  . A fast heartbeat  . A bad rash all over body  . Dizziness and weakness   Immunizations Administered    Name Date Dose VIS Date Route   Pfizer COVID-19 Vaccine 05/03/2019 12:02 PM 0.3 mL 02/01/2019 Intramuscular   Manufacturer: Pfizer, [employer]   Lot: [id number]   NDC: [address]-5     At 1225pm pt sat down for 30 minute observation post Pfizer vaccination #1. Pt c/o fast heart rate. BP 125/113. P 147. O2 sat 100%. Pt breathing rapid and shallow. C/o anxiousness. EMS at side of pt assessing. AT 1235pm BP 142/106, P 134 02 100sat . EMS calling Truck. 1240 Pt escorted by EMS via wheelchair to ambulance to be transported to hospital for evaluation.

## 2019-05-08 LAB — CULTURE, BLOOD (ROUTINE X 2)
Culture: NO GROWTH
Special Requests: ADEQUATE

## 2019-05-27 ENCOUNTER — Ambulatory Visit: Payer: Self-pay | Attending: Internal Medicine

## 2019-05-27 DIAGNOSIS — Z23 Encounter for immunization: Secondary | ICD-10-CM

## 2019-05-27 NOTE — Progress Notes (Signed)
   Covid-19 Vaccination Clinic  Name:  Jill Ramirez    MRN: [id number] DOB: 1985-06-04  05/27/2019  Ms. Jill Ramirez was observed post Covid-19 immunization for 30 minutes based on pre-vaccination screening without incident. She was provided with Vaccine Information Sheet and instruction to access the V-Safe system.   Ms. Jill Ramirez was instructed to call 911 with any severe reactions post vaccine: Marland Kitchen Difficulty breathing  . Swelling of face and throat  . A fast heartbeat  . A bad rash all over body  . Dizziness and weakness   Immunizations Administered    Name Date Dose VIS Date Route   Pfizer COVID-19 Vaccine 05/27/2019  4:29 PM 0.3 mL 02/01/2019 Intramuscular   Manufacturer: [employer], [employer]   Lot: [id number]   NDC: [address]-5

## 2019-06-11 ENCOUNTER — Other Ambulatory Visit: Payer: Self-pay

## 2019-06-12 ENCOUNTER — Encounter: Payer: Self-pay | Admitting: Women's Health

## 2019-06-12 ENCOUNTER — Ambulatory Visit (INDEPENDENT_AMBULATORY_CARE_PROVIDER_SITE_OTHER): Payer: Self-pay | Admitting: Women's Health

## 2019-06-12 VITALS — BP 110/80 | Ht 64.0 in | Wt 143.0 lb

## 2019-06-12 DIAGNOSIS — Z01419 Encounter for gynecological examination (general) (routine) without abnormal findings: Secondary | ICD-10-CM

## 2019-06-12 MED ORDER — PROAIR RESPICLICK 108 (90 BASE) MCG/ACT IN AEPB: 2.0000 | INHALATION_SPRAY | RESPIRATORY_TRACT | 6 refills | Status: DC | PRN

## 2019-06-12 MED ORDER — SUMATRIPTAN SUCCINATE 25 MG PO TABS: 25.0000 mg | ORAL_TABLET | ORAL | 6 refills | Status: DC | PRN

## 2019-06-12 MED ORDER — EPINEPHRINE 0.3 MG/0.3ML IJ SOAJ: 0.3000 mg | INTRAMUSCULAR | 1 refills | Status: AC | PRN

## 2019-06-12 NOTE — Progress Notes (Signed)
   Iris Severiano Gilbert Rello Aug 02, 1985 [id number]   History:  34 y.o. presents for annual exam.  10/2014 Mirena IUD rare bleeding.  Normal Pap history.  History of headaches rare use of Imitrex.  History of asthma and allergies as a rescue inhaler, Zyrtec and requested an EpiPen.  Had a severe reaction to the Covid vaccine and was seen at the hospital.    Gynecologic History Patient's last menstrual period was 05/08/2019. Period Pattern: (!) Irregular Menstrual Flow: Moderate Menstrual Control: Maxi pad, Tampon Dysmenorrhea: (!) Mild Dysmenorrhea Symptoms: Cramping  Past medical history, past surgical history, family history and social history were all reviewed and documented in the EPIC chart.  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  Daughter is 39 has had Gardasil and is an Teacher, English as a foreign language.  ROS:  A ROS was performed and pertinent positives and negatives are included.  Exam:  Vitals:   06/12/19 1355  BP: 110/80  Weight: 143 lb (64.9 kg)  Height: 5\' 4"  (1.626 m)   Body mass index is 24.55 kg/m.  General appearance:  Normal Thyroid:  Symmetrical, normal in size, without palpable masses or nodularity. Respiratory  Auscultation:  Clear without wheezing or rhonchi Cardiovascular  Auscultation:  Regular rate, without rubs, murmurs or gallops  Edema/varicosities:  Not grossly evident Abdominal  Soft,nontender, without masses, guarding or rebound.  Liver/spleen:  No organomegaly noted  Hernia:  None appreciated  Skin  Inspection:  Grossly normal   Breasts: Examined lying and sitting.   Right: Without masses, retractions, discharge or axillary adenopathy.   Left: Without masses, retractions, discharge or axillary adenopathy. Gentitourinary   Inguinal/mons:  Normal without inguinal adenopathy  External genitalia:  Normal  BUS/Urethra/Skene's glands:  Normal  Vagina:  Normal  Cervix:  Normal IUD strings present  Uterus:  normal in size, shape and contour.  Midline and  mobile  Adnexa/parametria:     Rt: Without masses or tenderness.   Lt: Without masses or tenderness.  Anus and perineum: Normal  Digital rectal exam: Normal sphincter tone without palpated masses or tenderness  Assessment/Plan:  34 y.o. M HF G1 P1 for annual exam with no complaints of vaginal discharge, urinary symptoms, or abdominal pain.  10/2014 Mirena IUD rare bleeding Migraines without aura rare Imitrex use Seasonal allergies  Plan: Contraception reviewed has done well with the IUD return office September 2021 for removal and replacement of IUD will check cost of Liletta versus Mirena, Dr. October 2021 to place.  SBEs, calcium rich foods, vitamin D 1000 IUs daily.  Encouraged regular exercise, congratulated on 8 pound weight loss with healthy lifestyle.  Imitrex 25 mg take 1, may repeat in 2 hours if no relief no more than 2 tablets in the 24-hour.  Prescription, proper use given and reviewed.  Refill of her rescue inhaler given.  EpiPen prescription given.  Pap normal 2019, new screening guidelines reviewed.  CBC, CMP both normal at ER visit 1 month ago after Covid vaccine.2020 [facility], 3:06 PM 06/12/2019

## 2019-06-12 NOTE — Patient Instructions (Addendum)
Pleasure knowin you! MVI daily Health Maintenance, Female Adopting a healthy lifestyle and getting preventive care are important in promoting health and wellness. Ask your health care provider about:  The right schedule for you to have regular tests and exams.  Things you can do on your own to prevent diseases and keep yourself healthy. What should I know about diet, weight, and exercise? Eat a healthy diet   Eat a diet that includes plenty of vegetables, fruits, low-fat dairy products, and lean protein.  Do not eat a lot of foods that are high in solid fats, added sugars, or sodium. Maintain a healthy weight Body mass index (BMI) is used to identify weight problems. It estimates body fat based on height and weight. Your health care provider can help determine your BMI and help you achieve or maintain a healthy weight. Get regular exercise Get regular exercise. This is one of the most important things you can do for your health. Most adults should:  Exercise for at least 150 minutes each week. The exercise should increase your heart rate and make you sweat (moderate-intensity exercise).  Do strengthening exercises at least twice a week. This is in addition to the moderate-intensity exercise.  Spend less time sitting. Even light physical activity can be beneficial. Watch cholesterol and blood lipids Have your blood tested for lipids and cholesterol at 35 years of age, then have this test every 5 years. Have your cholesterol levels checked more often if:  Your lipid or cholesterol levels are high.  You are older than 35 years of age.  You are at high risk for heart disease. What should I know about cancer screening? Depending on your health history and family history, you may need to have cancer screening at various ages. This may include screening for:  Breast cancer.  Cervical cancer.  Colorectal cancer.  Skin cancer.  Lung cancer. What should I know about heart disease,  diabetes, and high blood pressure? Blood pressure and heart disease  High blood pressure causes heart disease and increases the risk of stroke. This is more likely to develop in people who have high blood pressure readings, are of African descent, or are overweight.  Have your blood pressure checked: ? Every 3-5 years if you are 13-50 years of age. ? Every year if you are 71 years old or older. Diabetes Have regular diabetes screenings. This checks your fasting blood sugar level. Have the screening done:  Once every three years after age 27 if you are at a normal weight and have a low risk for diabetes.  More often and at a younger age if you are overweight or have a high risk for diabetes. What should I know about preventing infection? Hepatitis B If you have a higher risk for hepatitis B, you should be screened for this virus. Talk with your health care provider to find out if you are at risk for hepatitis B infection. Hepatitis C Testing is recommended for:  Everyone born from [birth year] through 1965.  Anyone with known risk factors for hepatitis C. Sexually transmitted infections (STIs)  Get screened for STIs, including gonorrhea and chlamydia, if: ? You are sexually active and are younger than 35 years of age. ? You are older than 35 years of age and your health care provider tells you that you are at risk for this type of infection. ? Your sexual activity has changed since you were last screened, and you are at increased risk for chlamydia or gonorrhea. Ask  your health care provider if you are at risk.  Ask your health care provider about whether you are at high risk for HIV. Your health care provider may recommend a prescription medicine to help prevent HIV infection. If you choose to take medicine to prevent HIV, you should first get tested for HIV. You should then be tested every 3 months for as long as you are taking the medicine. Pregnancy  If you are about to stop having your  period (premenopausal) and you may become pregnant, seek counseling before you get pregnant.  Take 400 to 800 micrograms (mcg) of folic acid every day if you become pregnant.  Ask for birth control (contraception) if you want to prevent pregnancy. Osteoporosis and menopause Osteoporosis is a disease in which the bones lose minerals and strength with aging. This can result in bone fractures. If you are 54 years old or older, or if you are at risk for osteoporosis and fractures, ask your health care provider if you should:  Be screened for bone loss.  Take a calcium or vitamin D supplement to lower your risk of fractures.  Be given hormone replacement therapy (HRT) to treat symptoms of menopause. Follow these instructions at home: Lifestyle  Do not use any products that contain nicotine or tobacco, such as cigarettes, e-cigarettes, and chewing tobacco. If you need help quitting, ask your health care provider.  Do not use street drugs.  Do not share needles.  Ask your health care provider for help if you need support or information about quitting drugs. Alcohol use  Do not drink alcohol if: ? Your health care provider tells you not to drink. ? You are pregnant, may be pregnant, or are planning to become pregnant.  If you drink alcohol: ? Limit how much you use to 0-1 drink a day. ? Limit intake if you are breastfeeding.  Be aware of how much alcohol is in your drink. In the [place] one drink equals one 12 oz bottle of beer (355 mL), one 5 oz glass of wine (148 mL), or one 1 oz glass of hard liquor (44 mL). General instructions  Schedule regular health, dental, and eye exams.  Stay current with your vaccines.  Tell your health care provider if: ? You often feel depressed. ? You have ever been abused or do not feel safe at home. Summary  Adopting a healthy lifestyle and getting preventive care are important in promoting health and wellness.  Follow your health care provider's  instructions about healthy diet, exercising, and getting tested or screened for diseases.  Follow your health care provider's instructions on monitoring your cholesterol and blood pressure. This information is not intended to replace advice given to you by your health care provider. Make sure you discuss any questions you have with your health care provider. Document Revised: 01/31/2018 Document Reviewed: 01/31/2018 Elsevier Patient Education  2020 [employer]. Levonorgestrel intrauterine device (IUD) What is this medicine? LEVONORGESTREL IUD (LEE voe nor jes trel) is a contraceptive (birth control) device. The device is placed inside the uterus by a healthcare professional. It is used to prevent pregnancy. This device can also be used to treat heavy bleeding that occurs during your period. This medicine may be used for other purposes; ask your health care provider or pharmacist if you have questions. COMMON BRAND NAME(S): Cameron Ali What should I tell my health care provider before I take this medicine? They need to know if you have any of these conditions:  abnormal Pap smear  cancer of the breast, uterus, or cervix  diabetes  endometritis  genital or pelvic infection now or in the past  have more than one sexual partner or your partner has more than one partner  heart disease  history of an ectopic or tubal pregnancy  immune system problems  IUD in place  liver disease or tumor  problems with blood clots or take blood-thinners  seizures  use intravenous drugs  uterus of unusual shape  vaginal bleeding that has not been explained  an unusual or allergic reaction to levonorgestrel, other hormones, silicone, or polyethylene, medicines, foods, dyes, or preservatives  pregnant or trying to get pregnant  breast-feeding How should I use this medicine? This device is placed inside the uterus by a health care professional. Talk to your  pediatrician regarding the use of this medicine in children. Special care may be needed. Overdosage: If you think you have taken too much of this medicine contact a poison control center or emergency room at once. NOTE: This medicine is only for you. Do not share this medicine with others. What if I miss a dose? This does not apply. Depending on the brand of device you have inserted, the device will need to be replaced every 3 to 6 years if you wish to continue using this type of birth control. What may interact with this medicine? Do not take this medicine with any of the following medications:  amprenavir  bosentan  fosamprenavir This medicine may also interact with the following medications:  aprepitant  armodafinil  barbiturate medicines for inducing sleep or treating seizures  bexarotene  boceprevir  griseofulvin  medicines to treat seizures like carbamazepine, ethotoin, felbamate, oxcarbazepine, phenytoin, topiramate  modafinil  pioglitazone  rifabutin  rifampin  rifapentine  some medicines to treat HIV infection like atazanavir, efavirenz, indinavir, lopinavir, nelfinavir, tipranavir, ritonavir  St. John's wort  warfarin This list may not describe all possible interactions. Give your health care provider a list of all the medicines, herbs, non-prescription drugs, or dietary supplements you use. Also tell them if you smoke, drink alcohol, or use illegal drugs. Some items may interact with your medicine. What should I watch for while using this medicine? Visit your doctor or health care professional for regular check ups. See your doctor if you or your partner has sexual contact with others, becomes HIV positive, or gets a sexual transmitted disease. This product does not protect you against HIV infection (AIDS) or other sexually transmitted diseases. You can check the placement of the IUD yourself by reaching up to the top of your vagina with clean fingers to  feel the threads. Do not pull on the threads. It is a good habit to check placement after each menstrual period. Call your doctor right away if you feel more of the IUD than just the threads or if you cannot feel the threads at all. The IUD may come out by itself. You may become pregnant if the device comes out. If you notice that the IUD has come out use a backup birth control method like condoms and call your health care provider. Using tampons will not change the position of the IUD and are okay to use during your period. This IUD can be safely scanned with magnetic resonance imaging (MRI) only under specific conditions. Before you have an MRI, tell your healthcare provider that you have an IUD in place, and which type of IUD you have in place. What side effects may I  notice from receiving this medicine? Side effects that you should report to your doctor or health care professional as soon as possible:  allergic reactions like skin rash, itching or hives, swelling of the face, lips, or tongue  fever, flu-like symptoms  genital sores  high blood pressure  no menstrual period for 6 weeks during use  pain, swelling, warmth in the leg  pelvic pain or tenderness  severe or sudden headache  signs of pregnancy  stomach cramping  sudden shortness of breath  trouble with balance, talking, or walking  unusual vaginal bleeding, discharge  yellowing of the eyes or skin Side effects that usually do not require medical attention (report to your doctor or health care professional if they continue or are bothersome):  acne  breast pain  change in sex drive or performance  changes in weight  cramping, dizziness, or faintness while the device is being inserted  headache  irregular menstrual bleeding within first 3 to 6 months of use  nausea This list may not describe all possible side effects. Call your doctor for medical advice about side effects. You may report side effects to  FDA at 1-800-FDA-1088. Where should I keep my medicine? This does not apply. NOTE: This sheet is a summary. It may not cover all possible information. If you have questions about this medicine, talk to your doctor, pharmacist, or health care provider.  2020 Elsevier/Gold Standard (2017-12-19 13:22:01)

## 2019-07-23 ENCOUNTER — Encounter: Payer: Self-pay | Admitting: Nurse Practitioner

## 2019-11-08 ENCOUNTER — Ambulatory Visit: Payer: Self-pay | Admitting: Obstetrics & Gynecology

## 2019-11-26 ENCOUNTER — Ambulatory Visit: Payer: Self-pay | Admitting: Obstetrics & Gynecology

## 2020-01-24 ENCOUNTER — Ambulatory Visit: Payer: Self-pay | Attending: Internal Medicine

## 2020-01-24 DIAGNOSIS — Z23 Encounter for immunization: Secondary | ICD-10-CM

## 2020-01-24 NOTE — Progress Notes (Signed)
   Covid-[facility] Vaccination [facility]  Name:  Jill Ramirez    MRN: [id number] DOB: [birth year]  01/24/2020  Jill Ramirez was observed post Covid-19 immunization for 30 minutes based on pre-vaccination screening without incident. She was provided with Vaccine Information Sheet and instruction to access the V-Safe system.   Jill Ramirez was instructed to call 911 with any severe reactions post vaccine: Marland Kitchen Difficulty breathing  . Swelling of face and throat  . A fast heartbeat  . A bad rash all over body  . Dizziness and weakness   Immunizations Administered    Name Date Dose VIS Date Route   Pfizer COVID-19 Vaccine 01/24/2020  5:21 PM 0.3 mL 12/11/2019 Intramuscular   Manufacturer: [employer], [employer]   Lot: [id number]   NDC: [address]-3

## 2020-06-11 ENCOUNTER — Encounter: Payer: Self-pay | Admitting: Nurse Practitioner

## 2020-06-15 ENCOUNTER — Ambulatory Visit (INDEPENDENT_AMBULATORY_CARE_PROVIDER_SITE_OTHER): Payer: Self-pay | Admitting: Nurse Practitioner

## 2020-06-15 ENCOUNTER — Encounter: Payer: Self-pay | Admitting: Nurse Practitioner

## 2020-06-15 ENCOUNTER — Other Ambulatory Visit (HOSPITAL_COMMUNITY)
Admission: RE | Admit: 2020-06-15 | Discharge: 2020-06-15 | Disposition: A | Discharge status: Home / Self Care | Payer: Self-pay | Source: Ambulatory Visit | Attending: Nurse Practitioner | Admitting: Nurse Practitioner

## 2020-06-15 ENCOUNTER — Other Ambulatory Visit: Payer: Self-pay

## 2020-06-15 VITALS — BP 116/74 | Ht 63.0 in | Wt 148.0 lb

## 2020-06-15 DIAGNOSIS — Z01419 Encounter for gynecological examination (general) (routine) without abnormal findings: Secondary | ICD-10-CM

## 2020-06-15 DIAGNOSIS — G43009 Migraine without aura, not intractable, without status migrainosus: Secondary | ICD-10-CM

## 2020-06-15 DIAGNOSIS — Z30431 Encounter for routine checking of intrauterine contraceptive device: Secondary | ICD-10-CM

## 2020-06-15 DIAGNOSIS — J4520 Mild intermittent asthma, uncomplicated: Secondary | ICD-10-CM

## 2020-06-15 MED ORDER — RIZATRIPTAN BENZOATE 5 MG PO TABS: 5.0000 mg | ORAL_TABLET | ORAL | 2 refills | Status: DC | PRN

## 2020-06-15 MED ORDER — PROAIR RESPICLICK 108 (90 BASE) MCG/ACT IN AEPB: 2.0000 | INHALATION_SPRAY | RESPIRATORY_TRACT | 1 refills | Status: DC | PRN

## 2020-06-15 NOTE — Patient Instructions (Signed)
Health Maintenance, Female Adopting a healthy lifestyle and getting preventive care are important in promoting health and wellness. Ask your health care provider about:  The right schedule for you to have regular tests and exams.  Things you can do on your own to prevent diseases and keep yourself healthy. What should I know about diet, weight, and exercise? Eat a healthy diet  Eat a diet that includes plenty of vegetables, fruits, low-fat dairy products, and lean protein.  Do not eat a lot of foods that are high in solid fats, added sugars, or sodium.   Maintain a healthy weight Body mass index (BMI) is used to identify weight problems. It estimates body fat based on height and weight. Your health care provider can help determine your BMI and help you achieve or maintain a healthy weight. Get regular exercise Get regular exercise. This is one of the most important things you can do for your health. Most adults should:  Exercise for at least 150 minutes each week. The exercise should increase your heart rate and make you sweat (moderate-intensity exercise).  Do strengthening exercises at least twice a week. This is in addition to the moderate-intensity exercise.  Spend less time sitting. Even light physical activity can be beneficial. Watch cholesterol and blood lipids Have your blood tested for lipids and cholesterol at 36 years of age, then have this test every 5 years. Have your cholesterol levels checked more often if:  Your lipid or cholesterol levels are high.  You are older than 36 years of age.  You are at high risk for heart disease. What should I know about cancer screening? Depending on your health history and family history, you may need to have cancer screening at various ages. This may include screening for:  Breast cancer.  Cervical cancer.  Colorectal cancer.  Skin cancer.  Lung cancer. What should I know about heart disease, diabetes, and high blood  pressure? Blood pressure and heart disease  High blood pressure causes heart disease and increases the risk of stroke. This is more likely to develop in people who have high blood pressure readings, are of African descent, or are overweight.  Have your blood pressure checked: ? Every 3-5 years if you are 18-39 years of age. ? Every year if you are 40 years old or older. Diabetes Have regular diabetes screenings. This checks your fasting blood sugar level. Have the screening done:  Once every three years after age 40 if you are at a normal weight and have a low risk for diabetes.  More often and at a younger age if you are overweight or have a high risk for diabetes. What should I know about preventing infection? Hepatitis B If you have a higher risk for hepatitis B, you should be screened for this virus. Talk with your health care provider to find out if you are at risk for hepatitis B infection. Hepatitis C Testing is recommended for:  Everyone born from 1945 through 1965.  Anyone with known risk factors for hepatitis C. Sexually transmitted infections (STIs)  Get screened for STIs, including gonorrhea and chlamydia, if: ? You are sexually active and are younger than 36 years of age. ? You are older than 36 years of age and your health care provider tells you that you are at risk for this type of infection. ? Your sexual activity has changed since you were last screened, and you are at increased risk for chlamydia or gonorrhea. Ask your health care provider   if you are at risk.  Ask your health care provider about whether you are at high risk for HIV. Your health care provider may recommend a prescription medicine to help prevent HIV infection. If you choose to take medicine to prevent HIV, you should first get tested for HIV. You should then be tested every 3 months for as long as you are taking the medicine. Pregnancy  If you are about to stop having your period (premenopausal) and  you may become pregnant, seek counseling before you get pregnant.  Take 400 to 800 micrograms (mcg) of folic acid every day if you become pregnant.  Ask for birth control (contraception) if you want to prevent pregnancy. Osteoporosis and menopause Osteoporosis is a disease in which the bones lose minerals and strength with aging. This can result in bone fractures. If you are 65 years old or older, or if you are at risk for osteoporosis and fractures, ask your health care provider if you should:  Be screened for bone loss.  Take a calcium or vitamin D supplement to lower your risk of fractures.  Be given hormone replacement therapy (HRT) to treat symptoms of menopause. Follow these instructions at home: Lifestyle  Do not use any products that contain nicotine or tobacco, such as cigarettes, e-cigarettes, and chewing tobacco. If you need help quitting, ask your health care provider.  Do not use street drugs.  Do not share needles.  Ask your health care provider for help if you need support or information about quitting drugs. Alcohol use  Do not drink alcohol if: ? Your health care provider tells you not to drink. ? You are pregnant, may be pregnant, or are planning to become pregnant.  If you drink alcohol: ? Limit how much you use to 0-1 drink a day. ? Limit intake if you are breastfeeding.  Be aware of how much alcohol is in your drink. In the [place] one drink equals one 12 oz bottle of beer (355 mL), one 5 oz glass of wine (148 mL), or one 1 oz glass of hard liquor (44 mL). General instructions  Schedule regular health, dental, and eye exams.  Stay current with your vaccines.  Tell your health care provider if: ? You often feel depressed. ? You have ever been abused or do not feel safe at home. Summary  Adopting a healthy lifestyle and getting preventive care are important in promoting health and wellness.  Follow your health care provider's instructions about healthy  diet, exercising, and getting tested or screened for diseases.  Follow your health care provider's instructions on monitoring your cholesterol and blood pressure. This information is not intended to replace advice given to you by your health care provider. Make sure you discuss any questions you have with your health care provider. Document Revised: 01/31/2018 Document Reviewed: 01/31/2018 Elsevier Patient Education  2021 Elsevier Inc.  

## 2020-06-15 NOTE — Progress Notes (Signed)
   Jill Ramirez 28-Mar-1985 [id number]   History:  35 y.o. G1P1001 presents for annual exam. Mirena inserted 10/2014 for pregnancy prevention. Has occasional light spotting. Normal pap history. History of migraines without aura and says these have worsened. She has taken Imitrex for this but says this no longer helps. She has used her moms Maxalt and it worked great so she would like a prescription for this as well as a neurology consult. She has asthma and uses a rescue inhaler occasionally with activity and needs a refill for this.   Gynecologic History No LMP recorded. (Menstrual status: IUD).   Contraception/Family planning: IUD  Health Maintenance Last Pap: 07/12/2017. Results were: normal Last mammogram: 12/2017. Results were: normal Last colonoscopy: N/A Last Dexa: N/A  Past medical history, past surgical history, family history and social history were all reviewed and documented in the EPIC chart. Married. 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   ROS:  A ROS was performed and pertinent positives and negatives are included.  Exam:  Vitals:   06/15/20 1035  BP: 116/74  Weight: 148 lb (67.1 kg)  Height: 5\' 3"  (1.6 m)   Body mass index is 26.22 kg/m.  General appearance:  Normal Thyroid:  Symmetrical, normal in size, without palpable masses or nodularity. Respiratory  Auscultation:  Clear without wheezing or rhonchi Cardiovascular  Auscultation:  Regular rate, without rubs, murmurs or gallops  Edema/varicosities:  Not grossly evident Abdominal  Soft,nontender, without masses, guarding or rebound.  Liver/spleen:  No organomegaly noted  Hernia:  None appreciated  Skin  Inspection:  Grossly normal Breasts: Examined lying and sitting.   Right: Without masses, retractions, nipple discharge or axillary adenopathy.   Left: Without masses, retractions, nipple discharge or axillary adenopathy. Gentitourinary   Inguinal/mons:  Normal without inguinal adenopathy  External  genitalia:  Normal appearing vulva with no masses, tenderness, or lesions  BUS/Urethra/Skene's glands:  Normal  Vagina:  Normal appearing with normal color and discharge, no lesions  Cervix:  Normal appearing without discharge or lesions  Uterus:  Normal in size, shape and contour.  Midline and mobile, nontender  Adnexa/parametria:     Rt: Normal in size, without masses or tenderness.   Lt: Normal in size, without masses or tenderness.  Anus and perineum: Normal  Assessment/Plan:  35 y.o. G1P1001 for annual exam.   Well female exam with routine gynecological exam - Plan: Cytology - PAP( [facility]). Education provided on SBEs, importance of preventative screenings, current guidelines, high calcium diet, regular exercise, and multivitamin daily.   Encounter for routine checking of intrauterine contraceptive device (IUD) - Mirena inserted 10/2014. She is aware Mirena is FDA approved for 7 years for pregnancy prevention. Due for exchange 10/2021.  Migraine without aura and without status migrainosus, not intractable - Plan: rizatriptan (MAXALT) 5 MG tablet at first sign of headache and repeat in 2 hours if symptoms persist. We will send referral for neurology for management.   Mild intermittent asthma without complication - Plan: PROAIR RESPICLICK 108 (90 Base) MCG/ACT AEPB every 4 hours as needed for shortness of breath.   Screening for cervical cancer - Normal Pap history.  Pap with reflex today.   Return in 1 year for annual.     11/2021 DNP, 11:06 AM 06/15/2020

## 2020-06-16 ENCOUNTER — Telehealth: Payer: Self-pay | Admitting: *Deleted

## 2020-06-16 DIAGNOSIS — G43909 Migraine, unspecified, not intractable, without status migrainosus: Secondary | ICD-10-CM

## 2020-06-16 NOTE — Telephone Encounter (Signed)
Referral placed in epic with [facility] they will call to schedule

## 2020-06-16 NOTE — Telephone Encounter (Signed)
-----   Message from Olivia Mackie, NP sent at 06/15/2020 11:59 AM EDT ----- Please send referral to neurology for management of migraines. Thank you.

## 2020-06-17 NOTE — Telephone Encounter (Signed)
Patient scheduled on 09/02/20 @ 2:30pm with Huston Foley, MD

## 2020-06-18 LAB — CYTOLOGY - PAP: Diagnosis: NEGATIVE

## 2020-07-23 IMAGING — US ULTRASOUND LEFT BREAST LIMITED
1 series · 10 of 10 positions shown · non-contrast
Comparison: None.

CLINICAL DATA: 32-year-old female presenting for evaluation of
palpable lumps in the bilateral breasts.Earlier [place] she did
have tenderness in the inferior breasts bilaterally as well as some
prominent nodular palpable areas which were symmetric in the
inferior breast as well. That has since improved.

EXAM:
DIGITAL DIAGNOSTIC BILATERAL MAMMOGRAM WITH CAD AND TOMO
ULTRASOUND BILATERAL BREAST

[Series 1: ultrasound left breast limited · 0.07mm/px · 10 of 10 slices shown]
[im 1/10]
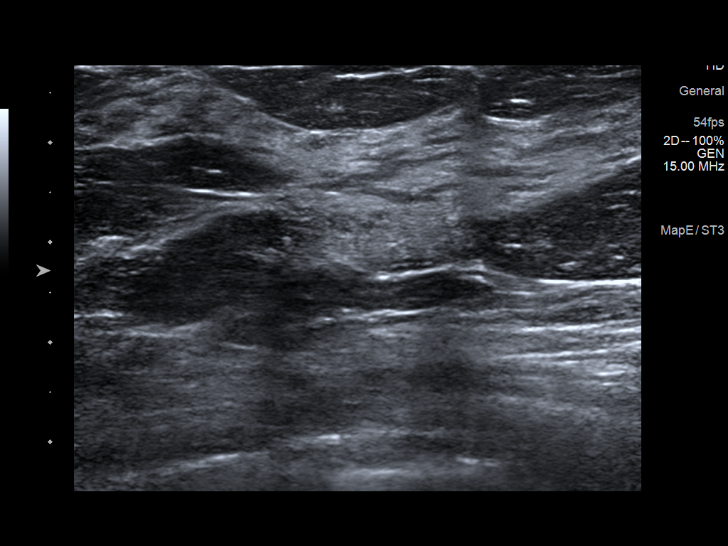
[im 2/10]
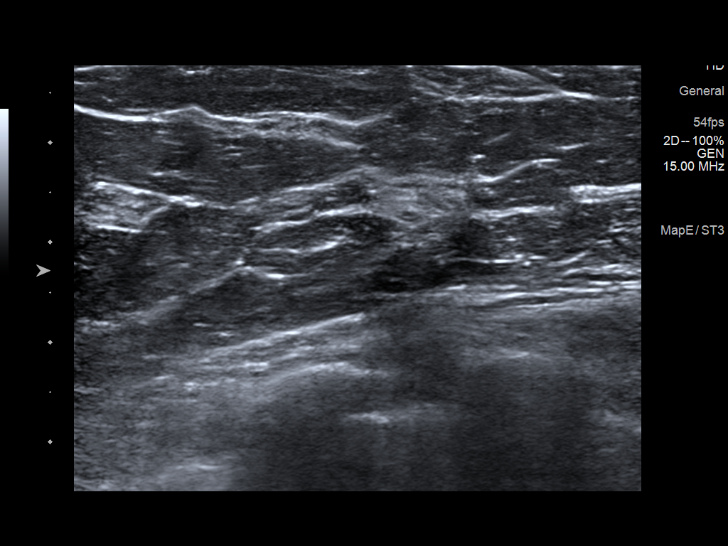
[im 3/10]
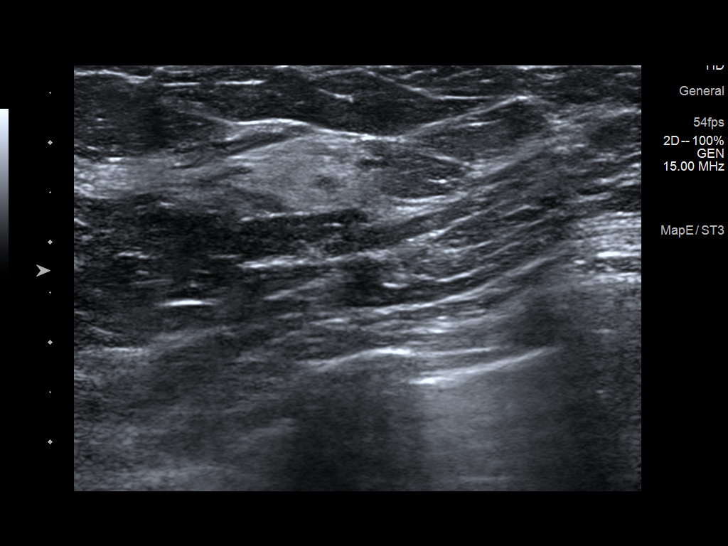
[im 4/10]
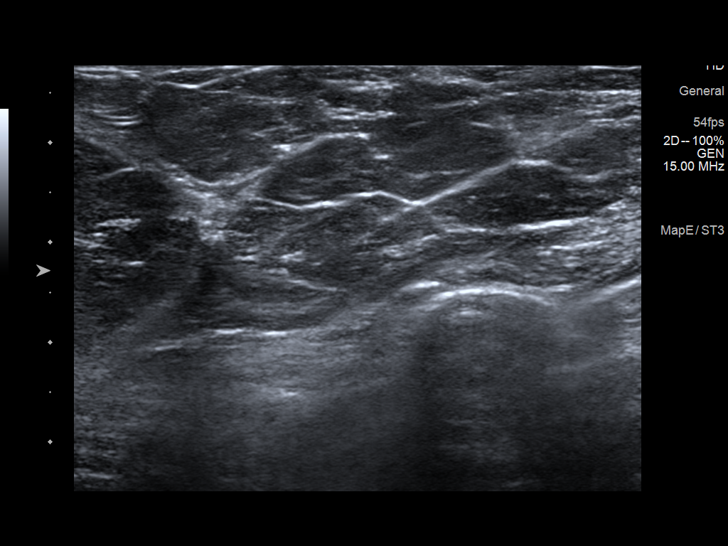
[im 5/10]
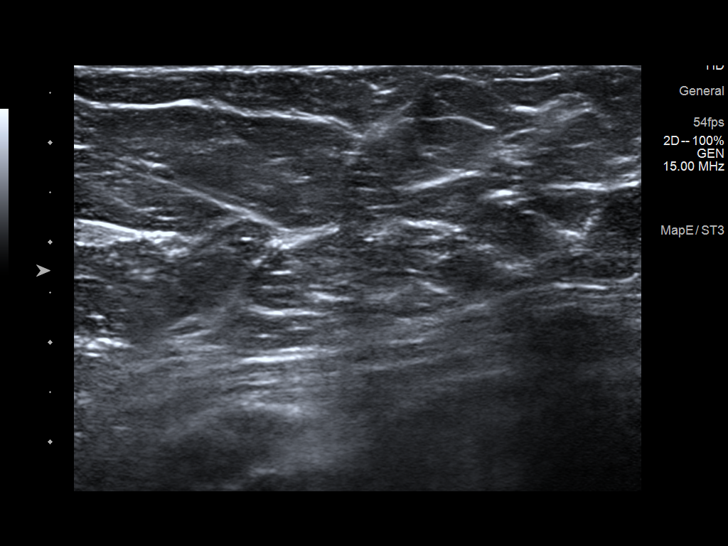
[im 6/10]
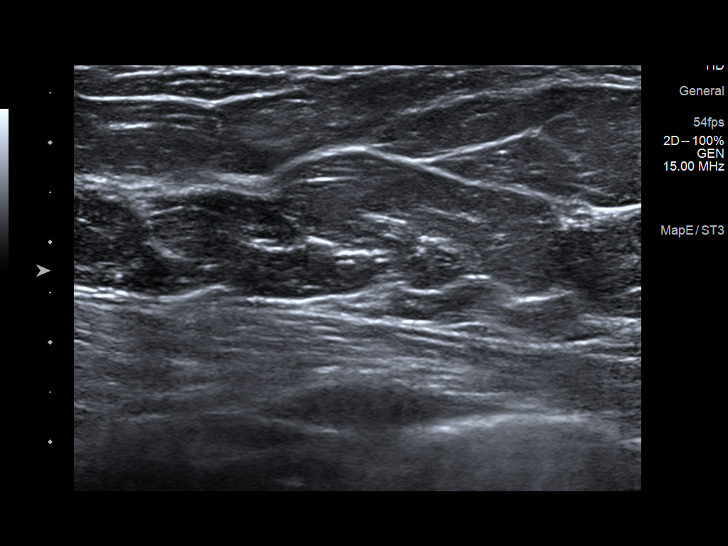
[im 7/10]
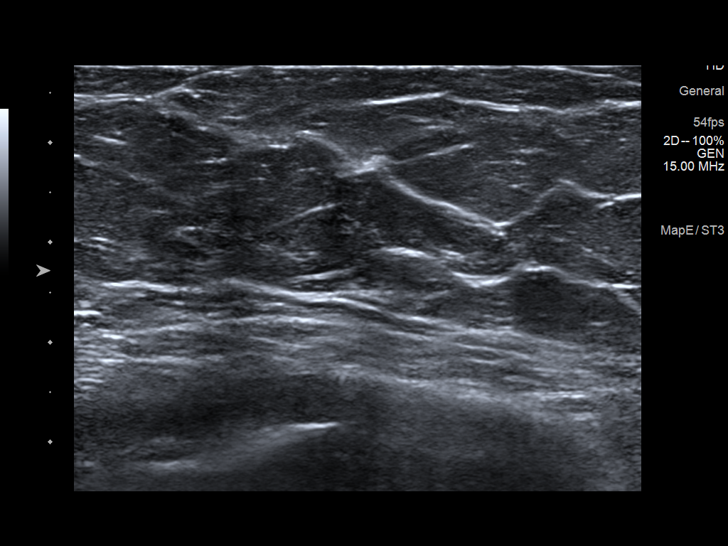
[im 8/10]
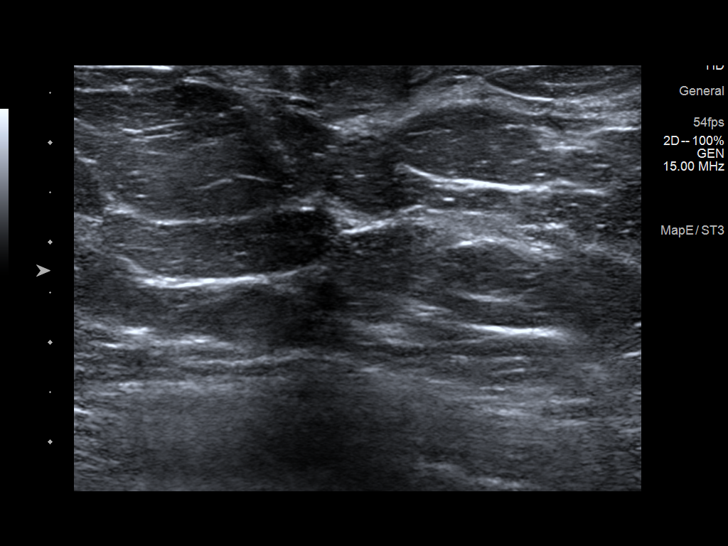
[im 9/10]
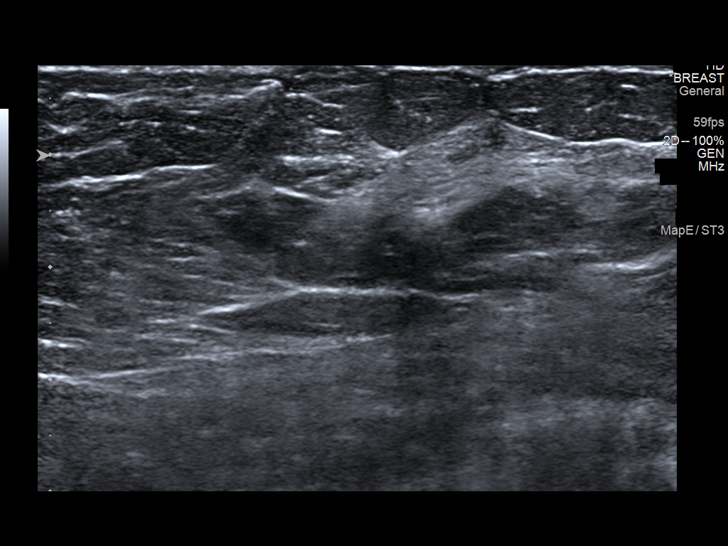
[im 10/10]
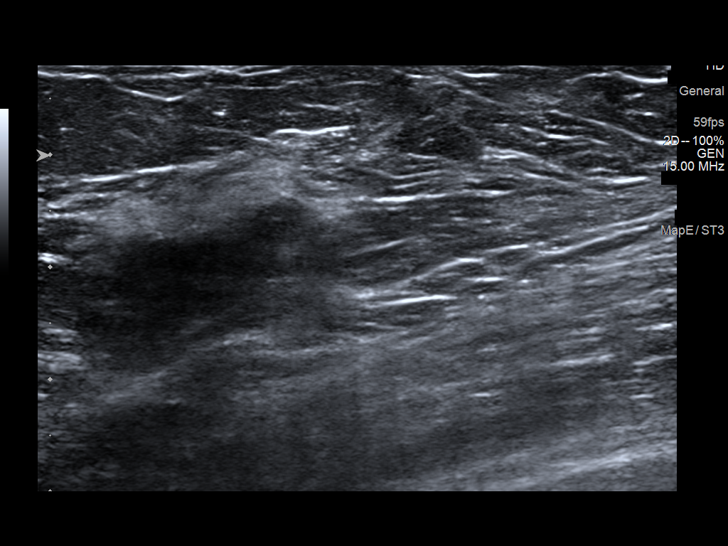

[10 of 10 positions shown; findings below may reference images not displayed]

ACR Breast Density Category c: The breast tissue is heterogeneously
dense, which may obscure small masses.
FINDINGS: A BB has been placed along the inferior aspect of the right breast
and the upper-outer quadrant of the left breast indicating the
palpable sites of concern. There are no suspicious mammographic
findings deep to the markers. No suspicious calcifications, masses
or areas of distortion are seen in the bilateral breasts.

Mammographic images were processed with CAD.

Physical exam of the palpable sites in the bilateral breast
demonstrates no discrete suspicious palpable masses.

Ultrasound of the right breast between 3 and 9 o'clock, and in the
retroareolar right breast demonstrates no suspicious masses or areas
of shadowing.

Ultrasound of the left breast between 3 and 9 o'clock demonstrates
normal fibroglandular tissue. No masses or suspicious areas of
shadowing are identified.
IMPRESSION: 1. There are no mammographic or targeted sonographic abnormalities
at the palpable sites of concern, or in the inferior breasts
bilaterally.

2.  No mammographic evidence of malignancy in the bilateral breasts.

RECOMMENDATION:
1. Clinical follow-up recommended for palpable and tender areas of
concern in the bilateral breasts. Any further workup should be based
on clinical grounds.

2. Screening mammogram at age 40 unless there are persistent or
intervening clinical concerns. (Code:50-P-NKE)

I have discussed the findings and recommendations with the patient.
Results were also provided in writing at the conclusion of the
visit. If applicable, a reminder letter will be sent to the patient
regarding the next appointment.

BI-RADS CATEGORY  1: Negative.

## 2020-07-23 IMAGING — MG DIGITAL DIAGNOSTIC BILATERAL MAMMOGRAM WITH TOMO AND CAD
6 of 12 series · 6 of 36 positions shown · non-contrast
Comparison: None.

CLINICAL DATA: 32-year-old female presenting for evaluation of
palpable lumps in the bilateral breasts.Earlier [place] she did
have tenderness in the inferior breasts bilaterally as well as some
prominent nodular palpable areas which were symmetric in the
inferior breast as well. That has since improved.

EXAM:
DIGITAL DIAGNOSTIC BILATERAL MAMMOGRAM WITH CAD AND TOMO
ULTRASOUND BILATERAL BREAST

[L CC synth-2D (1 of 2)]
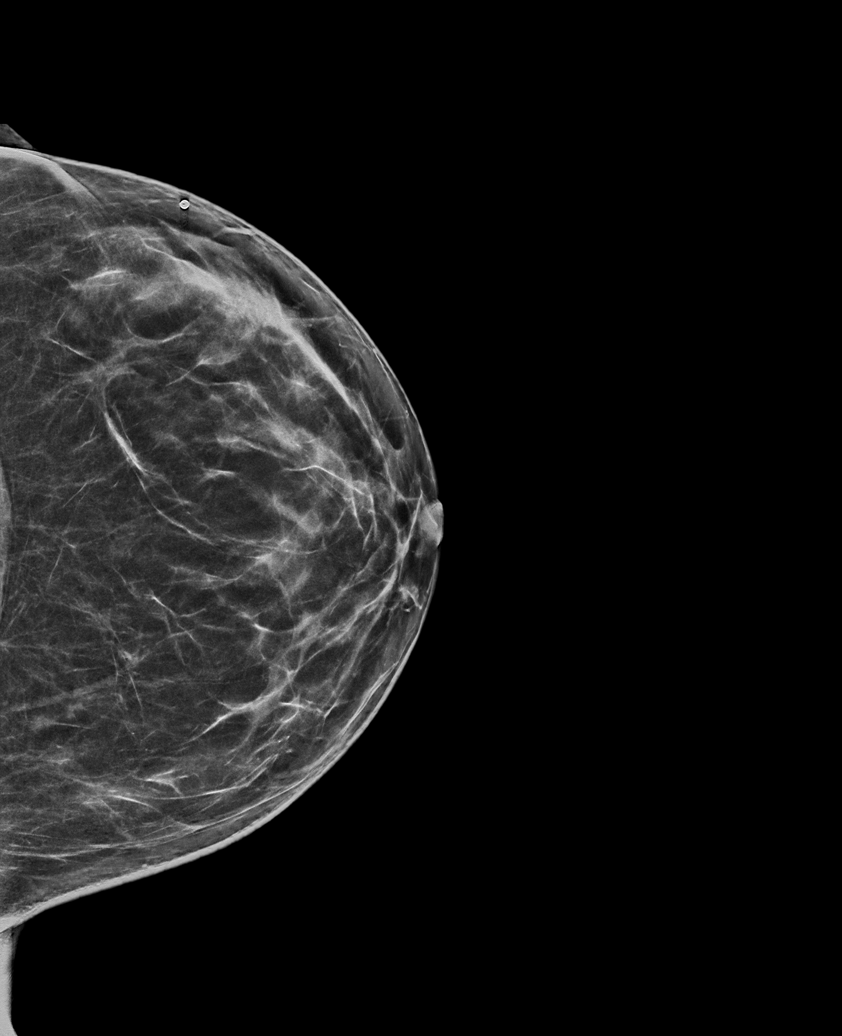

[L CC synth-2D (2 of 2)]
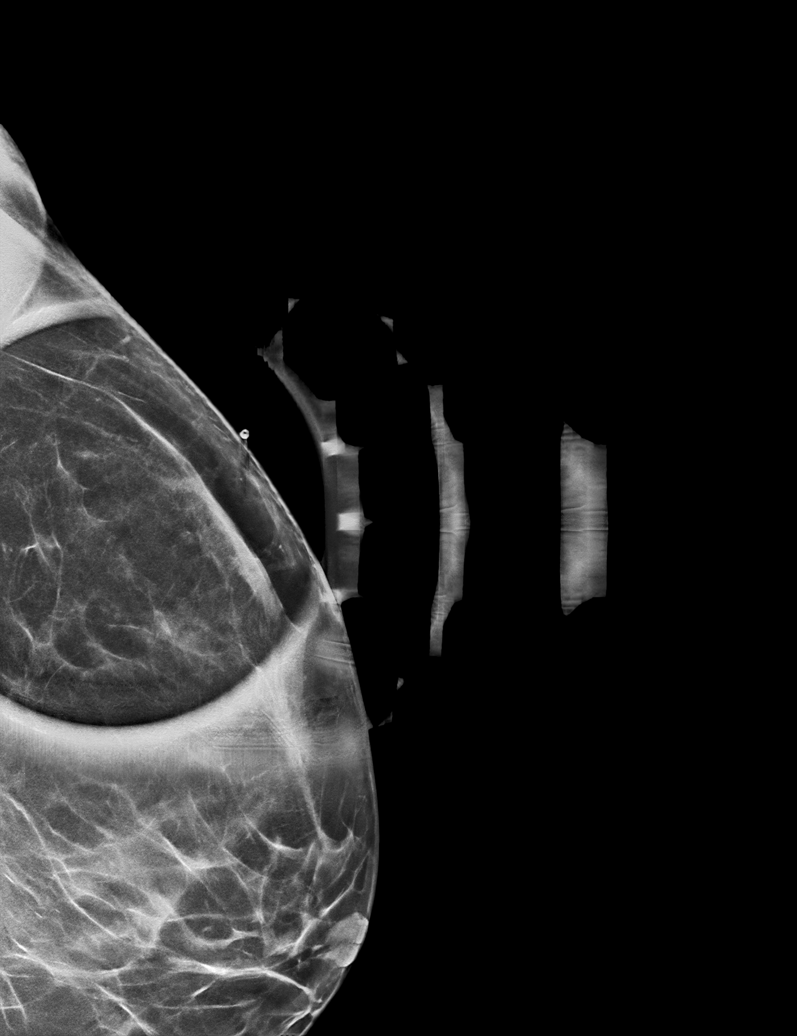

[R MLO synth-2D]
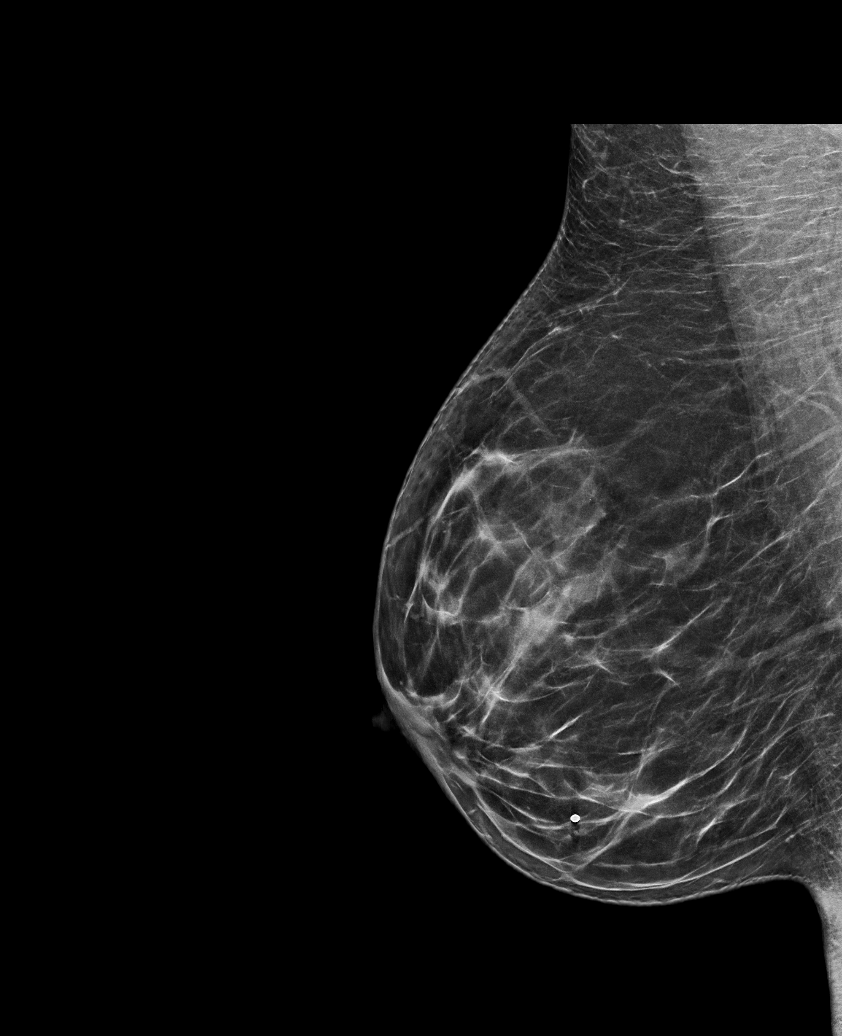

[R ML synth-2D]
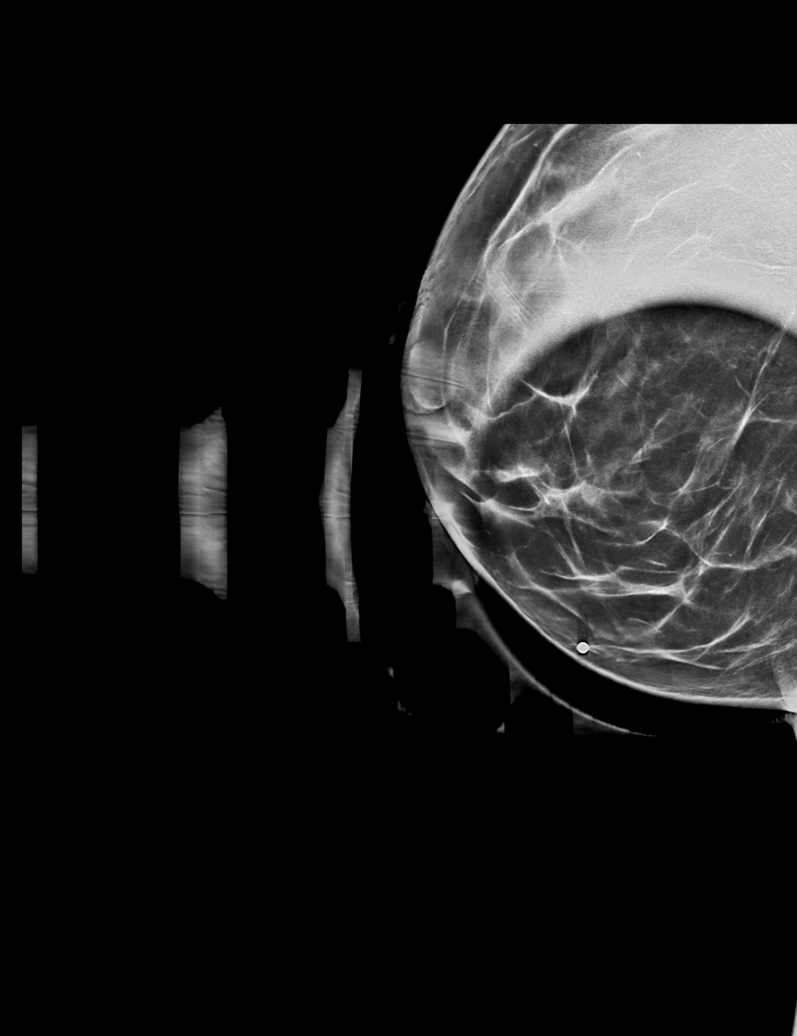

[L MLO synth-2D]
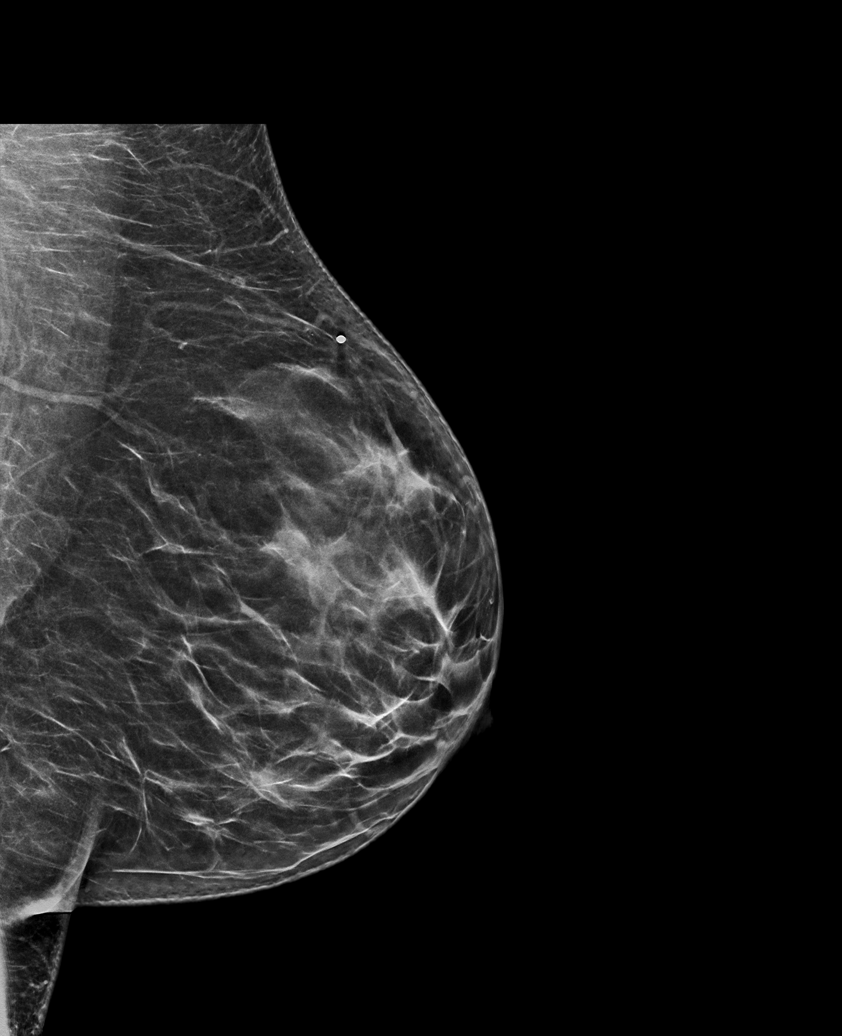

[R CC synth-2D]
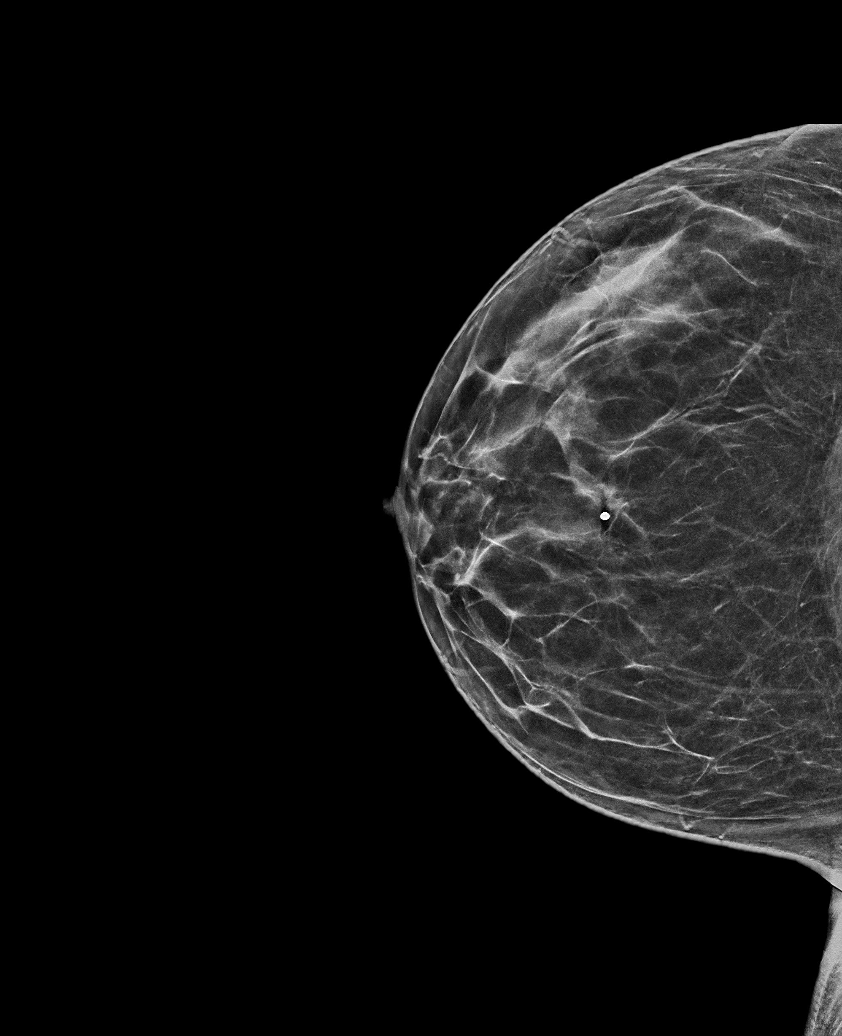

[6 of 36 positions shown; findings below may reference images not displayed]

ACR Breast Density Category c: The breast tissue is heterogeneously
dense, which may obscure small masses.
FINDINGS: A BB has been placed along the inferior aspect of the right breast
and the upper-outer quadrant of the left breast indicating the
palpable sites of concern. There are no suspicious mammographic
findings deep to the markers. No suspicious calcifications, masses
or areas of distortion are seen in the bilateral breasts.

Mammographic images were processed with CAD.

Physical exam of the palpable sites in the bilateral breast
demonstrates no discrete suspicious palpable masses.

Ultrasound of the right breast between 3 and 9 o'clock, and in the
retroareolar right breast demonstrates no suspicious masses or areas
of shadowing.

Ultrasound of the left breast between 3 and 9 o'clock demonstrates
normal fibroglandular tissue. No masses or suspicious areas of
shadowing are identified.
IMPRESSION: 1. There are no mammographic or targeted sonographic abnormalities
at the palpable sites of concern, or in the inferior breasts
bilaterally.

2.  No mammographic evidence of malignancy in the bilateral breasts.

RECOMMENDATION:
1. Clinical follow-up recommended for palpable and tender areas of
concern in the bilateral breasts. Any further workup should be based
on clinical grounds.

2. Screening mammogram at age 39 unless there are persistent or
intervening clinical concerns. (Code:50-[id number])

I have discussed the findings and recommendations with the patient.
Results were also provided in writing at the conclusion of the
visit. If applicable, a reminder letter will be sent to the patient
regarding the next appointment.

BI-RADS CATEGORY  1: Negative.

## 2020-08-03 ENCOUNTER — Telehealth: Payer: Self-pay

## 2020-08-03 ENCOUNTER — Other Ambulatory Visit: Payer: Self-pay | Admitting: Nurse Practitioner

## 2020-08-03 DIAGNOSIS — G43011 Migraine without aura, intractable, with status migrainosus: Secondary | ICD-10-CM

## 2020-08-03 MED ORDER — RIZATRIPTAN BENZOATE 10 MG PO TABS: 10.0000 mg | ORAL_TABLET | ORAL | 1 refills | Status: DC | PRN

## 2020-08-03 NOTE — Telephone Encounter (Signed)
Spoke with patient and informed her. °

## 2020-08-03 NOTE — Telephone Encounter (Signed)
Maxalt can be increased to 10 mg. With same instructions to take at first sign of headache and may repeat in 2 hours is headache persists with max of 2 tablets per 24 hours. I will send in prescription for new dose. Thank you.

## 2020-08-03 NOTE — Telephone Encounter (Signed)
Patient was prescribed Maxalt for migraine headache in April.  She said she has had migraine all weekend (x3 days) and is about out of Rx.  She asked if you would refill it and can it be increased so it will help.  She was referred to neurology back in April but soonest appt is  09/02/20 @ 2:30pm with Huston Foley, MD.  She called their office today to see if they could see her sooner but this is still first available.

## 2020-09-02 ENCOUNTER — Other Ambulatory Visit: Payer: Self-pay

## 2020-09-02 ENCOUNTER — Telehealth: Payer: Self-pay | Admitting: Neurology

## 2020-09-02 ENCOUNTER — Encounter: Payer: Self-pay | Admitting: Neurology

## 2020-09-02 ENCOUNTER — Ambulatory Visit (INDEPENDENT_AMBULATORY_CARE_PROVIDER_SITE_OTHER): Payer: Self-pay | Admitting: Neurology

## 2020-09-02 VITALS — BP 120/81 | HR 108 | Ht 63.0 in | Wt 151.2 lb

## 2020-09-02 DIAGNOSIS — G43009 Migraine without aura, not intractable, without status migrainosus: Secondary | ICD-10-CM

## 2020-09-02 MED ORDER — UBRELVY 50 MG PO TABS: 50.0000 mg | ORAL_TABLET | ORAL | 3 refills | Status: DC | PRN

## 2020-09-02 MED ORDER — ONDANSETRON HCL 4 MG PO TABS: 4.0000 mg | ORAL_TABLET | Freq: Three times a day (TID) | ORAL | 0 refills | Status: DC | PRN

## 2020-09-02 NOTE — Patient Instructions (Signed)
It was nice to meet you today.   Please remember, common headache triggers are: sleep deprivation, dehydration, overheating, stress, hypoglycemia or skipping meals and blood sugar fluctuations, excessive pain medications or excessive alcohol use or caffeine withdrawal. Some people have food triggers such as aged cheese, orange juice or chocolate, especially dark chocolate, or MSG (monosodium glutamate). Try to avoid these headache triggers as much possible. It may be helpful to keep a headache diary to figure out what makes your headaches worse or brings them on and what alleviates them. Some people report headache onset after exercise but studies have shown that regular exercise may actually prevent headaches from coming. If you have exercise-induced headaches, please make sure that you drink plenty of fluid before and after exercising and that you do not over do it and do not overheat.  As discussed, we can consider medication for migraine prevention in the near future.  For now, I would like for you to continue with Maxalt 10 mg as needed and we can also have you try Ubrelvy, 50 mg strength: Take 1 pill at onset of migraine headache, may repeat in 2 hours.  You can take Maxalt or Ubrelvy, do not combine both pills.  Bernita Raisin may cause sleepiness and nausea.    For nausea, I recommend as needed Zofran 4 mg.    We will do a brain MRI with and without contrast. We will call you to schedule after we have insurance approval. If you don't hear back from our office by next week, call us back.   Please schedule your a routine eye exam as you may be due for this year.  Your neurological exam is benign which is reassuring.  Please follow-up routinely to see one of our nurse practitioners in about 3 months.

## 2020-09-02 NOTE — Telephone Encounter (Signed)
09/02/20 self-pay sent to [facility]

## 2020-09-02 NOTE — Progress Notes (Signed)
Subjective:    Patient ID: Jill Ramirez is a 35 y.o. female.  HPI    Huston Foley, MD, PhD [facility] [address] [address] [place], [place] [address]  Dear [name],   I saw your patient, Jill Ramirez, upon your kind request in my neurologic clinic today for initial consultation of her recurrent headaches, concern for migraines.  The patient is unaccompanied today.  As you know, Jill Ramirez is a 35 year old right-handed woman with an underlying medical history of asthma, allergies, and mildly overweight state, who reports having had migrainous headaches for the past several years, essentially since her teenage years.  Migraines were infrequent in the past, but about 8 years ago she had increase in work-related stress and noticed an increase in her migraine frequency.  She is currently no longer working.  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.  She reports that her migraines occur about once or twice per month currently but the severity may be more intense than before.  They may last a day or up to 3 days which is rare.  Headaches are often pounding and one-sided, she does not typically has any aura.  She has associated nausea and light sensitivity.  She has not tried anything for her nausea.  She does not necessarily have benefit from sleeping and it is hard for her to sleep in general.  She takes melatonin 10 mg at night.  She has a family history of migraines.  She has a family history on her dad side of brain tumors, unclear what kind.  She is a non-smoker and drinks alcohol occasionally, once or twice a week, limits her caffeine to 1 serving per day and hydrates well.  She stays active.  She lives with her husband and 57 year old daughter.  She denies any sudden onset of one-sided weakness or numbness or tingling or droopy face or slurring of speech.  She has had intermittent left arm tingling but it is  independent of her headaches.  She has not had any significant visual disturbance, had an eye exam last year and may be due for her 1 year checkup.  She has prescription eyeglasses for nighttime use while driving.   Triggers of her migraines are primarily stress. I reviewed your office note from 06/15/2020.  She has been on sumatriptan in the past which was helpful but it became less effective.  She recently tried Maxalt 5 mg and this was increased to 10 mg in June 2022.  Maxalt has helped.  Her Past Medical History Is Significant For: Past Medical History:  Diagnosis Date   Allergy    Asthma    Migraine     Her Past Surgical History Is Significant For: No past surgical history on file.  Her Family History Is Significant For: Family History  Problem Relation Age of Onset   Asthma Daughter    Heart disease Maternal Grandmother    Diabetes Maternal Grandmother    Hypertension Maternal Grandmother    Hypertension Mother    Diabetes Maternal Grandfather     Her Social History Is Significant For: Social History   Socioeconomic History   Marital status: Married    Spouse name: Jillyn Hidden Rello   Number of children: 1   Years of education: 12+   Highest education level: Not on file  Occupational History   Occupation: Event Planning    Employer: [employer]  Tobacco Use  Smoking status: Never   Smokeless tobacco: Never  Vaping Use   Vaping Use: Never used  Substance and Sexual Activity   Alcohol use: Yes    Alcohol/week: 3.0 standard drinks    Types: 3 Standard drinks or equivalent per week   Drug use: Never   Sexual activity: Yes    Partners: Male    Birth control/protection: I.U.D.    Comment: Mirena inserted 10-2014-1st intercourse 35 yo-Fewer than 5 partners  Other Topics Concern   Not on file  Social History Narrative   ** Merged History Encounter **       From [place]  Came to the [place] in 2005.  Lives with her husband and their daughter.   Social  Determinants of Health   Financial Resource Strain: Not on file  Food Insecurity: Not on file  Transportation Needs: Not on file  Physical Activity: Not on file  Stress: Not on file  Social Connections: Not on file    Her Allergies Are:  Allergies  Allergen Reactions   Vicodin [Hydrocodone-Acetaminophen] Anaphylaxis   Vicodin [Hydrocodone-Acetaminophen] Swelling  :   Her Current Medications Are:  Outpatient Encounter Medications as of 09/02/2020  Medication Sig   Biotin w/ Vitamins C & E (HAIR/SKIN/NAILS PO) Take 1 capsule by mouth daily.   Calcium Carb-Cholecalciferol (CALCIUM 1000 + D PO) Take by mouth.   cetirizine (ZYRTEC) 10 MG tablet Take 10 mg by mouth daily.   EPINEPHrine 0.3 mg/0.3 mL IJ SOAJ injection Inject 0.3 mLs (0.3 mg total) into the muscle as needed for anaphylaxis.   ibuprofen (ADVIL,MOTRIN) 600 MG tablet Take 1 tablet (600 mg total) by mouth every 6 (six) hours as needed.   levonorgestrel (MIRENA) 20 MCG/24HR IUD 1 each by Intrauterine route once.   Melatonin 10 MG TABS Take 10 mg by mouth at bedtime.   Menaquinone-7 (VITAMIN K2 PO) Take by mouth.   OVER THE COUNTER MEDICATION /colon broom (for constipation)   phentermine (ADIPEX-P) 37.5 MG tablet Take 37.5 mg by mouth daily.   PROAIR RESPICLICK 108 (90 Base) MCG/ACT AEPB Inhale 2 puffs into the lungs every 4 (four) hours as needed (shortness of breath).   rizatriptan (MAXALT) 10 MG tablet Take 1 tablet (10 mg total) by mouth as needed for migraine. May repeat in 2 hours if needed   Vitamin D-Vitamin K (VITAMIN K2-VITAMIN D3 PO) Take 1 tablet by mouth daily.   No facility-administered encounter medications on file as of 09/02/2020.  :   Review of Systems:  Out of a complete 14 point review of systems, all are reviewed and negative with the exception of these symptoms as listed below:  Review of Systems  Neurological:        Migraines.   Objective:  Neurological Exam  Physical Exam Physical  Examination:   Vitals:   09/02/20 1411  BP: 120/81  Pulse: (!) 108    General Examination: The patient is a very pleasant 35 y.o. female in no acute distress. She appears well-developed and well-nourished and well groomed.   HEENT: Normocephalic, atraumatic, pupils are equal, round and reactive to light and accommodation. Funduscopic exam is normal with sharp disc margins noted. Extraocular tracking is good without limitation to gaze excursion or nystagmus noted. Normal smooth pursuit is noted. Hearing is grossly intact. Tympanic membranes are clear bilaterally. Face is symmetric with normal facial animation and normal facial sensation. Speech is clear with no dysarthria noted. There is no hypophonia. There is no lip, neck/head, jaw or  voice tremor. Neck is supple with full range of passive and active motion. There are no carotid bruits on auscultation. Oropharynx exam reveals: No significant mouth dryness, good dental hygiene, no significant airway crowding, mild erythema noted in the back of her throat and mild clear nasal discharge noted.  Tongue protrudes centrally and palate elevates symmetrically.  Chest: Clear to auscultation without wheezing, rhonchi or crackles noted.  Heart: S1+S2+0, regular and normal without murmurs, rubs or gallops noted.   Abdomen: Soft, non-tender and non-distended.  Extremities: There is no pitting edema in the distal lower extremities bilaterally.  Skin: Warm and dry without trophic changes noted. There are no varicose veins.  Musculoskeletal: exam reveals no obvious joint deformities.   Neurologically:  Mental status: The patient is awake, alert and oriented in all 4 spheres. Her immediate and remote memory, attention, language skills and fund of knowledge are appropriate. There is no evidence of aphasia, agnosia, apraxia or anomia. Speech is clear with normal prosody and enunciation. Thought process is linear. Mood is normal and affect is normal.  Cranial  nerves II - XII are as described above under HEENT exam. In addition: shoulder shrug is normal with equal shoulder height noted. Motor exam: Normal bulk, strength and tone is noted. There is no drift, tremor or rebound. Romberg is negative. Reflexes are 2+ throughout. Babinski: Toes are flexor bilaterally. Fine motor skills and coordination: intact with normal finger taps, normal hand movements, normal rapid alternating patting, normal foot taps and normal foot agility.  Cerebellar testing: No dysmetria or intention tremor on finger to nose testing. Heel to shin is unremarkable bilaterally. There is no truncal or gait ataxia.  Sensory exam: intact to light touch, vibration, temperature in the upper and lower extremities.  Gait, station and balance: She stands easily. No veering to one side is noted. No leaning to one side is noted. Posture is age-appropriate and stance is narrow based. Gait shows normal stride length and normal pace. No problems turning are noted. Tandem walk is unremarkable.           Assessment and Plan:   In summary, Jill Ramirez is a very pleasant 35 y.o.-year old female with an underlying medical history of asthma, allergies, and mildly overweight state, who presents for evaluation of her recurrent headaches.  Her history is suggestive of migraines without aura.  Current frequency does not support intractability but she has noticed a longer headache up to 3 days in the recent past.  As needed use of Maxalt has helped, she has increased it to 10 mg recently.  She has not had any trouble tolerating it.  We talked about other acute treatment options including one of the newer oral medications such as Ubrelvy and Nurtec.  I suggested she try Ubrelvy 50 mg as needed.  She can take Maxalt or Bernita Raisin, is advised not to combine them.  We also talked about the possibility of starting a preventative such as an oral daily preventative or one of the injectables in the near future,  she was also advised that Botox injections can be used for prevention.  We will pick up our discussion in the near future.  For now, for as needed use, she is advised to use Ubrelvy and for nausea I suggested a trial of Zofran generic 4 mg as needed.  We talked about her exam, it is nonfocal and reassuring.  She is advised to schedule her routine eye examination as she may be due.  Since  she has never had a brain scan, I would like to suggest we proceed with a brain MRI with and without contrast.  In preparation of her brain MRI I would like to make sure her kidney function is okay and she is agreeable to getting her CMP drawn today.  We will call her with her MRI results.  She is advised regarding common headache triggers.  She was provided written instructions and a new prescription for Ubrelvy as well as ondansetron.  She is advised to follow-up in this clinic to see one of our nurse practitioners in 3 months, sooner if needed.  I answered all her questions today and she was in agreement.   Thank you very much for allowing me to participate in the care of this nice patient. If I can be of any further assistance to you please do not hesitate to call me at [phone].  Sincerely,   Huston FoleySaima Dantae Meunier, MD, PhD

## 2020-09-03 ENCOUNTER — Telehealth: Payer: Self-pay | Admitting: Neurology

## 2020-09-03 LAB — COMPREHENSIVE METABOLIC PANEL
ALT: 18 IU/L (ref 0–32)
AST: 23 IU/L (ref 0–40)
Albumin/Globulin Ratio: 2 (ref 1.2–2.2)
Albumin: 5.1 g/dL — ABNORMAL HIGH (ref 3.8–4.8)
Alkaline Phosphatase: 75 IU/L (ref 44–121)
BUN/Creatinine Ratio: 20 (ref 9–23)
BUN: 15 mg/dL (ref 6–20)
Bilirubin Total: 0.2 mg/dL (ref 0.0–1.2)
CO2: 23 mmol/L (ref 20–29)
Calcium: 10.3 mg/dL — ABNORMAL HIGH (ref 8.7–10.2)
Chloride: 101 mmol/L (ref 96–106)
Creatinine, Ser: 0.76 mg/dL (ref 0.57–1.00)
Globulin, Total: 2.5 g/dL (ref 1.5–4.5)
Glucose: 87 mg/dL (ref 65–99)
Potassium: 4.6 mmol/L (ref 3.5–5.2)
Sodium: 139 mmol/L (ref 134–144)
Total Protein: 7.6 g/dL (ref 6.0–8.5)
eGFR: 104 mL/min/{1.73_m2} (ref >59)

## 2020-09-03 NOTE — Telephone Encounter (Signed)
Pt is asking for a call from RN to discuss the $900.00 cost of the Ubrogepant (UBRELVY) 50 MG TABS

## 2020-09-03 NOTE — Telephone Encounter (Signed)
See my chart message from 09/03/2020.

## 2020-09-22 ENCOUNTER — Other Ambulatory Visit: Payer: Self-pay

## 2020-09-22 ENCOUNTER — Ambulatory Visit
Admission: RE | Admit: 2020-09-22 | Discharge: 2020-09-22 | Disposition: A | Discharge status: Home / Self Care | Payer: No Typology Code available for payment source | Source: Ambulatory Visit | Attending: Neurology | Admitting: Neurology

## 2020-09-22 DIAGNOSIS — G43009 Migraine without aura, not intractable, without status migrainosus: Secondary | ICD-10-CM

## 2020-09-22 MED ORDER — GADOBENATE DIMEGLUMINE 529 MG/ML IV SOLN
14.0000 mL | Freq: Once | INTRAVENOUS | Status: AC | PRN
  Administered 2020-09-22: 14 mL via INTRAVENOUS

## 2020-09-28 ENCOUNTER — Telehealth: Payer: Self-pay | Admitting: *Deleted

## 2020-09-28 NOTE — Telephone Encounter (Signed)
-----   Message from Huston Foley, MD sent at 09/28/2020  8:16 AM EDT ----- Please call patient and advise her that her brain MRI with and without contrast was reported as normal. She can follow-up as scheduled with the nurse practitioner.

## 2020-09-28 NOTE — Telephone Encounter (Signed)
Spoke with patient and advised MRI brain normal.  She verbalized appreciation and understanding.  She has trouble paying for the Ubrelvy as it is $900 and she does not have insurance.  I let her know I would send a link to her MyChart for a patient assistance program [employer] Leaver) that she can checkout and apply for if she desires.

## 2020-12-14 NOTE — Patient Instructions (Signed)
Below is our plan:  We will continue rizatriptan for abortive therapy. Try switching Zyrtec to either Xyzal or Allegra. Combine with Mucinex extended release every 12 hours for the next week.   Please make sure you are staying well hydrated. I recommend 50-60 ounces daily. Well balanced diet and regular exercise encouraged. Consistent sleep schedule with 6-8 hours recommended.   Please continue follow up with care team as directed.   Follow up with me as needed   You may receive a survey regarding today's visit. I encourage you to leave honest feed back as I do use this information to improve patient care. Thank you for seeing me today!

## 2020-12-14 NOTE — Progress Notes (Signed)
Chief Complaint  Patient presents with   Follow-up    Rm 1, alone. Here for 3 month migraine f/u. Pt reports doing well. Pt was unable to start Ubrelvy due to cost. Pt was unable to use link provided to apply for patient assistance. Pt needing refill on her Maxalt. Migraines are 2x a month.      HISTORY OF PRESENT ILLNESS:  12/17/20 ALL:  Jill Ramirez is a 35 y.o. female here today for follow up for migraines. MRI was normal. Rizatriptan was helpful. Bernita Raisin added for additional abortive therapy but she is uninsured and could not afford. She reports doing well. She has about 2 migraines a month. Rizatriptan works well for abortive therapy. She has a headache today and contributes it to allergies. She has a sore throat and right sided sinus pressure that started 24 hours ago. No fever. Covid negative.    HISTORY (copied from Dr Teofilo Pod previous note)  Dear Jill Ramirez,    I saw your patient, Jill Ramirez, upon your kind request in my neurologic clinic today for initial consultation of her recurrent headaches, concern for migraines.  The patient is unaccompanied today.  As you know, Ms. Jill Ramirez is a 35 year old right-handed woman with an underlying medical history of asthma, allergies, and mildly overweight state, who reports having had migrainous headaches for the past several years, essentially since her teenage years.  Migraines were infrequent in the past, but about 8 years ago she had increase in work-related stress and noticed an increase in her migraine frequency.  She is currently no longer working.  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.  She reports that her migraines occur about once or twice per month currently but the severity may be more intense than before.  They may last a day or up to 3 days which is rare.  Headaches are often pounding and one-sided, she does not typically has any aura.  She has associated nausea and light  sensitivity.  She has not tried anything for her nausea.  She does not necessarily have benefit from sleeping and it is hard for her to sleep in general.  She takes melatonin 10 mg at night.  She has a family history of migraines.  She has a family history on her dad side of brain tumors, unclear what kind.  She is a non-smoker and drinks alcohol occasionally, once or twice a week, limits her caffeine to 1 serving per day and hydrates well.  She stays active.  She lives with her husband and 58 year old daughter.  She denies any sudden onset of one-sided weakness or numbness or tingling or droopy face or slurring of speech.  She has had intermittent left arm tingling but it is independent of her headaches.  She has not had any significant visual disturbance, had an eye exam last year and may be due for her 1 year checkup.  She has prescription eyeglasses for nighttime use while driving.   Triggers of her migraines are primarily stress. I reviewed your office note from 06/15/2020.  She has been on sumatriptan in the past which was helpful but it became less effective.  She recently tried Maxalt 5 mg and this was increased to 10 mg in June 2022.  Maxalt has helped.   REVIEW OF SYSTEMS: Out of a complete 14 system review of symptoms, the patient complains only of the following symptoms, headaches, seasonal allergies and all other reviewed systems are  negative.   ALLERGIES: Allergies  Allergen Reactions   Vicodin [Hydrocodone-Acetaminophen] Anaphylaxis   Vicodin [Hydrocodone-Acetaminophen] Swelling     HOME MEDICATIONS: Outpatient Medications Prior to Visit  Medication Sig Dispense Refill   Biotin w/ Vitamins C & E (HAIR/SKIN/NAILS PO) Take 1 capsule by mouth daily.     Calcium Carb-Cholecalciferol (CALCIUM 1000 + D PO) Take by mouth.     cetirizine (ZYRTEC) 10 MG tablet Take 10 mg by mouth daily.     EPINEPHrine 0.3 mg/0.3 mL IJ SOAJ injection Inject 0.3 mLs (0.3 mg total) into the muscle as needed  for anaphylaxis. 1 each 1   ibuprofen (ADVIL,MOTRIN) 600 MG tablet Take 1 tablet (600 mg total) by mouth every 6 (six) hours as needed. 30 tablet 0   levonorgestrel (MIRENA) 20 MCG/24HR IUD 1 each by Intrauterine route once.     Melatonin 10 MG TABS Take 10 mg by mouth at bedtime.     Menaquinone-7 (VITAMIN K2 PO) Take by mouth.     ondansetron (ZOFRAN) 4 MG tablet Take 1 tablet (4 mg total) by mouth every 8 (eight) hours as needed for nausea or vomiting. 20 tablet 0   OVER THE COUNTER MEDICATION /colon broom (for constipation)     phentermine (ADIPEX-P) 37.5 MG tablet Take 37.5 mg by mouth daily.     PROAIR RESPICLICK 108 (90 Base) MCG/ACT AEPB Inhale 2 puffs into the lungs every 4 (four) hours as needed (shortness of breath). 1 each 1   Ubrogepant (UBRELVY) 50 MG TABS Take 50 mg by mouth as needed (may repeat once in 2 hours. no more than 2 pills in 24 h.). 10 tablet 3   Vitamin D-Vitamin K (VITAMIN K2-VITAMIN D3 PO) Take 1 tablet by mouth daily.     rizatriptan (MAXALT) 10 MG tablet Take 1 tablet (10 mg total) by mouth as needed for migraine. May repeat in 2 hours if needed 10 tablet 1   No facility-administered medications prior to visit.     PAST MEDICAL HISTORY: Past Medical History:  Diagnosis Date   Allergy    Asthma    Migraine      PAST SURGICAL HISTORY: History reviewed. No pertinent surgical history.   FAMILY HISTORY: Family History  Problem Relation Age of Onset   Asthma Daughter    Heart disease Maternal Grandmother    Diabetes Maternal Grandmother    Hypertension Maternal Grandmother    Hypertension Mother    Diabetes Maternal Grandfather      SOCIAL HISTORY: Social History   Socioeconomic History   Marital status: Married    Spouse name: Jillyn Hidden Rello   Number of children: 1   Years of education: 12+   Highest education level: Not on file  Occupational History   Occupation: Event Planning    Employer: [employer]  Tobacco Use   Smoking  status: Never   Smokeless tobacco: Never  Vaping Use   Vaping Use: Never used  Substance and Sexual Activity   Alcohol use: Yes    Alcohol/week: 3.0 standard drinks    Types: 3 Standard drinks or equivalent per week   Drug use: Never   Sexual activity: Yes    Partners: Male    Birth control/protection: I.U.D.    Comment: Mirena inserted 10-2014-1st intercourse 35 yo-Fewer than 5 partners  Other Topics Concern   Not on file  Social History Narrative   ** Merged History Encounter **       From [place]  Came to  the [place] in 2005.  Lives with her husband and their daughter.   Social Determinants of Health   Financial Resource Strain: Not on file  Food Insecurity: Not on file  Transportation Needs: Not on file  Physical Activity: Not on file  Stress: Not on file  Social Connections: Not on file  Intimate Partner Violence: Not on file     PHYSICAL EXAM  Vitals:   12/17/20 1240  BP: 127/80  Pulse: 72  Weight: 150 lb 8 oz (68.3 kg)  Height: 5\' 4"  (1.626 m)   Body mass index is 25.83 kg/m.  Generalized: Well developed, in no acute distress  Cardiology: normal rate and rhythm, no murmur auscultated  Respiratory: clear to auscultation bilaterally    Neurological examination  Mentation: Alert oriented to time, place, history taking. Follows all commands speech and language fluent Cranial nerve II-XII: Pupils were equal round reactive to light. Extraocular movements were full, visual field were full on confrontational test. Facial sensation and strength were normal. Head turning and shoulder shrug  were normal and symmetric. Motor: The motor testing reveals 5 over 5 strength of all 4 extremities. Good symmetric motor tone is noted throughout.  Gait and station: Gait is normal.    DIAGNOSTIC DATA (LABS, IMAGING, TESTING) - I reviewed patient records, labs, notes, testing and imaging myself where available.  Lab Results  Component Value Date   WBC 8.5 05/03/2019   HGB  13.9 05/03/2019   HCT 41.9 05/03/2019   MCV 90.1 05/03/2019   PLT 318 05/03/2019      Component Value Date/Time   NA 139 09/02/2020 1533   K 4.6 09/02/2020 1533   CL 101 09/02/2020 1533   CO2 23 09/02/2020 1533   GLUCOSE 87 09/02/2020 1533   GLUCOSE 104 (H) 05/03/2019 1345   BUN 15 09/02/2020 1533   CREATININE 0.76 09/02/2020 1533   CREATININE 0.72 10/13/2014 1011   CALCIUM 10.3 (H) 09/02/2020 1533   PROT 7.6 09/02/2020 1533   ALBUMIN 5.1 (H) 09/02/2020 1533   AST 23 09/02/2020 1533   ALT 18 09/02/2020 1533   ALKPHOS 75 09/02/2020 1533   BILITOT 0.2 09/02/2020 1533   GFRNONAA >60 05/03/2019 1345   GFRAA >60 05/03/2019 1345   Lab Results  Component Value Date   CHOL 179 10/13/2014   HDL 62 10/13/2014   LDLCALC 106 10/13/2014   TRIG 56 10/13/2014   CHOLHDL 2.9 10/13/2014   No results found for: HGBA1C No results found for: VITAMINB12 Lab Results  Component Value Date   TSH 0.666 10/13/2014    No flowsheet data found.   No flowsheet data found.   ASSESSMENT AND PLAN  35 y.o. year old female  has a past medical history of Allergy, Asthma, and Migraine. here with    Migraine without aura and without status migrainosus, not intractable  Intractable migraine without aura and with status migrainosus - Plan: rizatriptan (MAXALT) 10 MG tablet  Iris is doing well. She will continue rizatriptan for abortive therapy. I have given her 2 boxes (4 tabs) of Nurtec to try for intractable headaches. She was advised to try switching antihistamine from Zyrtec to Xyzal or Allegra for 1 week and add Mucinex ER every 12 hours for 1 week. If sinus symptoms not improved she should see PCP. Follow up as needed.    No orders of the defined types were placed in this encounter.    Meds ordered this encounter  Medications   rizatriptan (MAXALT) 10 MG tablet  Sig: Take 1 tablet (10 mg total) by mouth as needed for migraine. May repeat in 2 hours if needed    Dispense:  10 tablet     Refill:  11       Loxley Schmale, MSN, FNP-C 12/17/2020, 1:21 PM  [facility] [address], [address] [place], [place] [address] [phone]

## 2020-12-17 ENCOUNTER — Ambulatory Visit (INDEPENDENT_AMBULATORY_CARE_PROVIDER_SITE_OTHER): Payer: Self-pay | Admitting: Family Medicine

## 2020-12-17 ENCOUNTER — Encounter: Payer: Self-pay | Admitting: Family Medicine

## 2020-12-17 VITALS — BP 127/80 | HR 72 | Ht 64.0 in | Wt 150.5 lb

## 2020-12-17 DIAGNOSIS — G43011 Migraine without aura, intractable, with status migrainosus: Secondary | ICD-10-CM

## 2020-12-17 DIAGNOSIS — G43009 Migraine without aura, not intractable, without status migrainosus: Secondary | ICD-10-CM

## 2020-12-17 MED ORDER — RIZATRIPTAN BENZOATE 10 MG PO TABS: 10.0000 mg | ORAL_TABLET | ORAL | 11 refills | Status: AC | PRN

## 2021-05-21 ENCOUNTER — Telehealth: Payer: Self-pay | Admitting: *Deleted

## 2021-05-21 NOTE — Telephone Encounter (Signed)
Patient called and left detailed message in triage voicemail requesting Rx for Albuterol inhaler reports Jill Ramirez filled for her in past. Patient reports she uses for allergies. Please advise  ?

## 2021-05-24 ENCOUNTER — Other Ambulatory Visit: Payer: Self-pay | Admitting: Nurse Practitioner

## 2021-05-24 DIAGNOSIS — J4520 Mild intermittent asthma, uncomplicated: Secondary | ICD-10-CM

## 2021-05-24 MED ORDER — PROAIR RESPICLICK 108 (90 BASE) MCG/ACT IN AEPB: 2.0000 | INHALATION_SPRAY | RESPIRATORY_TRACT | 1 refills | Status: DC | PRN

## 2021-05-24 NOTE — Telephone Encounter (Signed)
Refill provided. Thanks!

## 2021-05-24 NOTE — Telephone Encounter (Signed)
Left message on patient voicemail Rx sent. ?

## 2021-06-16 ENCOUNTER — Ambulatory Visit (INDEPENDENT_AMBULATORY_CARE_PROVIDER_SITE_OTHER): Payer: Self-pay | Admitting: Nurse Practitioner

## 2021-06-16 ENCOUNTER — Encounter: Payer: Self-pay | Admitting: Nurse Practitioner

## 2021-06-16 VITALS — BP 122/76 | Ht 62.0 in | Wt 149.0 lb

## 2021-06-16 DIAGNOSIS — Z30431 Encounter for routine checking of intrauterine contraceptive device: Secondary | ICD-10-CM

## 2021-06-16 DIAGNOSIS — G43009 Migraine without aura, not intractable, without status migrainosus: Secondary | ICD-10-CM

## 2021-06-16 DIAGNOSIS — R631 Polydipsia: Secondary | ICD-10-CM

## 2021-06-16 DIAGNOSIS — R208 Other disturbances of skin sensation: Secondary | ICD-10-CM

## 2021-06-16 DIAGNOSIS — R61 Generalized hyperhidrosis: Secondary | ICD-10-CM

## 2021-06-16 DIAGNOSIS — Z01419 Encounter for gynecological examination (general) (routine) without abnormal findings: Secondary | ICD-10-CM

## 2021-06-16 NOTE — Progress Notes (Signed)
? ?  Jill Ramirez 1985/08/04 [id number] ? ? ?History:  36 y.o. G1P1001 presents for annual exam. She complains of night sweats, increased thirst, and burning of hands and feet at night. The burning does not occur nightly. No changes in medications. She takes calcium, vitamin D, and K2 supplements. Mirena inserted 10/2014, has occasional light spotting. Normal pap history. History of migraines without aura, good management with Maxalt. She saw neurology last year.  ? ?Gynecologic History ?No LMP recorded. (Menstrual status: IUD). ?  ?Contraception/Family planning: IUD ?Sexually active: Yes ? ?Health Maintenance ?Last Pap: 06/15/2020. Results were: Normal, 3-year repeat ?Last mammogram: 12/2017. Results were: Normal ?Last colonoscopy: N/A ?Last Dexa: N/A ? ?Past medical history, past surgical history, family history and social history were all reviewed and documented in the EPIC chart. 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  ? ?ROS:  A ROS was performed and pertinent positives and negatives are included. ? ?Exam: ? ?Vitals:  ? 06/16/21 1116  ?BP: 122/76  ?Weight: 149 lb (67.6 kg)  ?Height: 5\' 2"  (1.575 m)  ? ? ?Body mass index is 27.25 kg/m?. ? ?General appearance:  Normal ?Thyroid:  Symmetrical, normal in size, without palpable masses or nodularity. ?Respiratory ? Auscultation:  Clear without wheezing or rhonchi ?Cardiovascular ? Auscultation:  Regular rate, without rubs, murmurs or gallops ? Edema/varicosities:  Not grossly evident ?Abdominal ? Soft,nontender, without masses, guarding or rebound. ? Liver/spleen:  No organomegaly noted ? Hernia:  None appreciated ? Skin ? Inspection:  Grossly normal ?Breasts: Examined lying and sitting.  ? Right: Without masses, retractions, nipple discharge or axillary adenopathy. ? ? Left: Without masses, retractions, nipple discharge or axillary adenopathy. ?Gentitourinary  ? Inguinal/mons:  Normal without inguinal adenopathy ? External genitalia:  Normal appearing  vulva with no masses, tenderness, or lesions ? BUS/Urethra/Skene's glands:  Normal ? Vagina:  Normal appearing with normal color and discharge, no lesions ? Cervix:  Normal appearing without discharge or lesions. IUD strings visible ? Uterus:  Normal in size, shape and contour.  Midline and mobile, nontender ? Adnexa/parametria:   ?  Rt: Normal in size, without masses or tenderness. ?  Lt: Normal in size, without masses or tenderness. ? Anus and perineum: Normal ? ?Patient informed chaperone available to be present for breast and pelvic exam. Patient has requested no chaperone to be present. Patient has been advised what will be completed during breast and pelvic exam.  ? ?Assessment/Plan:  36 y.o. G1P1001 for annual exam.  ? ?Well female exam with routine gynecological exam - Plan: CBC with Differential/Platelet, Comprehensive metabolic panel, Lipid panel. Education provided on SBEs, importance of preventative screenings, current guidelines, high calcium diet, regular exercise, and multivitamin daily.  ? ?Encounter for routine checking of intrauterine contraceptive device (IUD) - Mirena IUD 10/2014, rare bleeding. Aware of 8-year FDA approval.  ? ?Increased thirst - Plan: CBC with Differential/Platelet, Comprehensive metabolic panel, Hemoglobin A1c ? ?Burning sensation of lower extremity - Plan: CBC with Differential/Platelet, Comprehensive metabolic panel, Vitamin B12, VITAMIN D 25 Hydroxy (Vit-D Deficiency, Fractures), TSH, Hemoglobin A1c ? ?Night sweats - Plan: TSH, Follicle stimulating hormone ? ?Screening for cervical cancer - Normal Pap history.  Will repeat at 3-year interval per guidelines.  ? ?Return in 1 year for annual.  ? ? ? ?11/2014 DNP, 11:34 AM 06/16/2021 ? ?

## 2021-06-17 LAB — CBC WITH DIFFERENTIAL/PLATELET
Absolute Monocytes: 478 cells/uL (ref 200–950)
Basophils Absolute: 122 cells/uL (ref 0–200)
Basophils Relative: 1.5 %
Eosinophils Absolute: 510 cells/uL — ABNORMAL HIGH (ref 15–500)
Eosinophils Relative: 6.3 %
HCT: 42.5 % (ref 35.0–45.0)
Hemoglobin: 14.1 g/dL (ref 11.7–15.5)
Lymphs Abs: 1725 cells/uL (ref 850–3900)
MCH: 30.1 pg (ref 27.0–33.0)
MCHC: 33.2 g/dL (ref 32.0–36.0)
MCV: 90.6 fL (ref 80.0–100.0)
MPV: 9.9 fL (ref 7.5–12.5)
Monocytes Relative: 5.9 %
Neutro Abs: 5265 cells/uL (ref 1500–7800)
Neutrophils Relative %: 65 %
Platelets: 339 10*3/uL (ref 140–400)
RBC: 4.69 10*6/uL (ref 3.80–5.10)
RDW: 12.4 % (ref 11.0–15.0)
Total Lymphocyte: 21.3 %
WBC: 8.1 10*3/uL (ref 3.8–10.8)

## 2021-06-17 LAB — COMPREHENSIVE METABOLIC PANEL
AG Ratio: 1.8 (calc) (ref 1.0–2.5)
ALT: 10 U/L (ref 6–29)
AST: 15 U/L (ref 10–30)
Albumin: 4.7 g/dL (ref 3.6–5.1)
Alkaline phosphatase (APISO): 59 U/L (ref 31–125)
BUN: 10 mg/dL (ref 7–25)
CO2: 24 mmol/L (ref 20–32)
Calcium: 9.9 mg/dL (ref 8.6–10.2)
Chloride: 102 mmol/L (ref 98–110)
Creat: 0.69 mg/dL (ref 0.50–0.97)
Globulin: 2.6 g/dL (calc) (ref 1.9–3.7)
Glucose, Bld: 83 mg/dL (ref 65–99)
Potassium: 5.1 mmol/L (ref 3.5–5.3)
Sodium: 137 mmol/L (ref 135–146)
Total Bilirubin: 0.6 mg/dL (ref 0.2–1.2)
Total Protein: 7.3 g/dL (ref 6.1–8.1)

## 2021-06-17 LAB — LIPID PANEL
Cholesterol: 197 mg/dL (ref <200)
HDL: 64 mg/dL (ref >=50)
LDL Cholesterol (Calc): 117 mg/dL (calc) — ABNORMAL HIGH
Non-HDL Cholesterol (Calc): 133 mg/dL (calc) — ABNORMAL HIGH (ref <130)
Total CHOL/HDL Ratio: 3.1 (calc) (ref <5.0)
Triglycerides: 67 mg/dL (ref <150)

## 2021-06-17 LAB — TSH: TSH: 0.87 mIU/L

## 2021-06-17 LAB — HEMOGLOBIN A1C
Hgb A1c MFr Bld: 5.5 % of total Hgb (ref <5.7)
Mean Plasma Glucose: 111 mg/dL
eAG (mmol/L): 6.2 mmol/L

## 2021-06-17 LAB — FOLLICLE STIMULATING HORMONE: FSH: 7.2 m[IU]/mL

## 2021-06-17 LAB — VITAMIN D 25 HYDROXY (VIT D DEFICIENCY, FRACTURES): Vit D, 25-Hydroxy: 62 ng/mL (ref 30–100)

## 2021-06-17 LAB — VITAMIN B12: Vitamin B-12: 674 pg/mL (ref 200–1100)

## 2021-06-17 LAB — EXTRA SPECIMEN

## 2022-07-13 ENCOUNTER — Other Ambulatory Visit: Payer: Self-pay | Admitting: Nurse Practitioner

## 2022-07-13 DIAGNOSIS — J4520 Mild intermittent asthma, uncomplicated: Secondary | ICD-10-CM

## 2022-07-14 NOTE — Telephone Encounter (Signed)
Med refill request: Proair Respiclick 108 PRN for SHOB Last AEX: 06/16/21 -TW Next AEX: 07/26/22 -TW Last MMG (if hormonal med) N//A Refill authorized: Please Advise?  Routing to Tiffany to review.

## 2022-07-20 ENCOUNTER — Other Ambulatory Visit: Payer: Self-pay | Admitting: Physician Assistant

## 2022-07-20 DIAGNOSIS — R1011 Right upper quadrant pain: Secondary | ICD-10-CM

## 2022-07-26 ENCOUNTER — Encounter: Payer: Self-pay | Admitting: Nurse Practitioner

## 2022-07-26 ENCOUNTER — Ambulatory Visit (INDEPENDENT_AMBULATORY_CARE_PROVIDER_SITE_OTHER): Payer: Self-pay | Admitting: Nurse Practitioner

## 2022-07-26 VITALS — BP 110/70 | HR 68 | Ht 64.0 in | Wt 158.0 lb

## 2022-07-26 DIAGNOSIS — Z30431 Encounter for routine checking of intrauterine contraceptive device: Secondary | ICD-10-CM

## 2022-07-26 DIAGNOSIS — Z01419 Encounter for gynecological examination (general) (routine) without abnormal findings: Secondary | ICD-10-CM

## 2022-07-26 NOTE — Progress Notes (Signed)
   Jill Ramirez December 16, 1985 [id number]   History:  37 y.o. G1P1001 presents for annual exam. Mirena inserted 10/2014, has occasional light spotting. Normal pap history. History of migraines without aura, good management with Maxalt. Recently established with PCP.   Gynecologic History No LMP recorded. (Menstrual status: IUD).   Contraception/Family planning: IUD Sexually active: Yes  Health Maintenance Last Pap: 06/15/2020. Results were: Normal, 3-year repeat Last mammogram: 12/2017. Results were: Normal Last colonoscopy: Not indicated Last Dexa: Not indicated  Past medical history, past surgical history, family history and social history were all reviewed and documented in the EPIC chart. Married. 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   ROS:  A ROS was performed and pertinent positives and negatives are included.  Exam:  Vitals:   07/26/22 0912  BP: 110/70  Pulse: 68  SpO2: 100%  Weight: 158 lb (71.7 kg)  Height: 5\' 4"  (1.626 m)     Body mass index is 27.12 kg/m.  General appearance:  Normal Thyroid:  Symmetrical, normal in size, without palpable masses or nodularity. Respiratory  Auscultation:  Clear without wheezing or rhonchi Cardiovascular  Auscultation:  Regular rate, without rubs, murmurs or gallops  Edema/varicosities:  Not grossly evident Abdominal  Soft,nontender, without masses, guarding or rebound.  Liver/spleen:  No organomegaly noted  Hernia:  None appreciated  Skin  Inspection:  Grossly normal Breasts: Examined lying and sitting.   Right: Without masses, retractions, nipple discharge or axillary adenopathy.   Left: Without masses, retractions, nipple discharge or axillary adenopathy. Gentitourinary   Inguinal/mons:  Normal without inguinal adenopathy  External genitalia:  Normal appearing vulva with no masses, tenderness, or lesions  BUS/Urethra/Skene's glands:  Normal  Vagina:  Normal appearing with normal color  and discharge, no lesions  Cervix:  Normal appearing without discharge or lesions. IUD strings visible  Uterus:  Normal in size, shape and contour.  Midline and mobile, nontender  Adnexa/parametria:     Rt: Normal in size, without masses or tenderness.   Lt: Normal in size, without masses or tenderness.  Anus and perineum: Normal  Patient informed chaperone available to be present for breast and pelvic exam. Patient has requested no chaperone to be present. Patient has been advised what will be completed during breast and pelvic exam.   Assessment/Plan:  37 y.o. G1P1001 for annual exam.   Well female exam with routine gynecological exam - Education provided on SBEs, importance of preventative screenings, current guidelines, high calcium diet, regular exercise, and multivitamin daily. Established with PCP. Plans to have labs done there.   Encounter for routine checking of intrauterine contraceptive device (IUD) - Mirena IUD 10/2014, rare bleeding. Aware of 8-year FDA approval. Plans to exchange in September.   Screening for cervical cancer - Normal Pap history.  Will repeat at 3-year interval per guidelines.   Return in 1 year for annual.     [name], 9:36 AM 07/26/2022

## 2022-08-17 ENCOUNTER — Ambulatory Visit
Admission: RE | Admit: 2022-08-17 | Discharge: 2022-08-17 | Disposition: A | Discharge status: Home / Self Care | Payer: No Typology Code available for payment source | Source: Ambulatory Visit | Attending: Physician Assistant | Admitting: Physician Assistant

## 2022-08-17 DIAGNOSIS — R1011 Right upper quadrant pain: Secondary | ICD-10-CM

## 2023-01-16 ENCOUNTER — Encounter: Payer: Self-pay | Admitting: Nurse Practitioner

## 2023-01-16 ENCOUNTER — Ambulatory Visit (INDEPENDENT_AMBULATORY_CARE_PROVIDER_SITE_OTHER): Payer: Self-pay | Admitting: Nurse Practitioner

## 2023-01-16 VITALS — BP 112/72 | HR 86

## 2023-01-16 DIAGNOSIS — N644 Mastodynia: Secondary | ICD-10-CM

## 2023-01-16 DIAGNOSIS — Z01812 Encounter for preprocedural laboratory examination: Secondary | ICD-10-CM

## 2023-01-16 DIAGNOSIS — Z30433 Encounter for removal and reinsertion of intrauterine contraceptive device: Secondary | ICD-10-CM

## 2023-01-16 LAB — PREGNANCY, URINE: Preg Test, Ur: NEGATIVE

## 2023-01-16 NOTE — Progress Notes (Signed)
   Jill Ramirez Apr 13, 1985 [id number]   History:  37 y.o. G1P1001 presents for exchange of Mirena IUD. Expired in September. Pt has been counseled about risks and benefits as well as complications. Consent is obtained today. Complains of bilateral breast pain, warm to touch for a week or so. Denies lumps, nipple discharge or skin changes.   No LMP recorded. (Menstrual status: IUD). STD testing: Declines  Past medical history, past surgical history, family history and social history were all reviewed and documented in the EPIC chart.  ROS:  A ROS was performed and pertinent positives and negatives are included.  Exam: Vitals:   01/16/23 1032  BP: 112/72  Pulse: 86  SpO2: 98%   There is no height or weight on file to calculate BMI.  Physical Exam Chest:  Breasts:    Right: Tenderness present. No swelling, inverted nipple, mass, nipple discharge or skin change.     Left: Tenderness present. No swelling, inverted nipple, mass, nipple discharge or skin change.  Lymphadenopathy:     Upper Body:     Right upper body: No supraclavicular or axillary adenopathy.     Left upper body: No supraclavicular or axillary adenopathy.    Pelvic exam: Vulva:  normal female genitalia Vagina:  normal vagina, no discharge, exudate, lesion, or erythema Cervix:  Non-tender, Negative CMT, no lesions or redness. Uterus:  normal shape, position and consistency   Procedure:  Speculum inserted. Cervix visualized, IUD string grasped with ring forceps and removed with ease. Cervix cleansed with Betadine x 3. Sterile gloves applied. Tenaculum placed on anterior cervix. Then uterus sounded to 7 cm. IUD inserted easily. Strings trimmed to 3 cm.  Minimal bleeding noted.  Pt tolerated the procedure well.  [name], CMA assisted with procedure.   Assessment/Plan:  Encounter for IUD removal and reinsertion - Plan: IUD Insertion, IUD removal  Pre-procedure lab exam - Plan: Pregnancy,  urine. Neg UPT  Breast pain - ~1 week, bilateral, no masses, discharge or skin changes. Normal breast exam today. Likely hormonal, will monitor.   Return for recheck 4-6 weeks Pt aware to call for any concerns Pt aware removal due no later than 8 years from insertion date, IUD card given to pt.   [name], 10:59 AM 01/16/2023

## 2023-02-27 ENCOUNTER — Ambulatory Visit (INDEPENDENT_AMBULATORY_CARE_PROVIDER_SITE_OTHER): Payer: Self-pay | Admitting: Nurse Practitioner

## 2023-02-27 VITALS — BP 106/62 | HR 77 | Wt 161.0 lb

## 2023-02-27 DIAGNOSIS — Z30431 Encounter for routine checking of intrauterine contraceptive device: Secondary | ICD-10-CM

## 2023-02-27 NOTE — Progress Notes (Signed)
     History:  38 y.o. G1P1001 here today for today for IUD string check; Mirena IUD exchange 01/16/2023. No complaints about the IUD, no concerning side effects.  The following portions of the patient's history were reviewed and updated as appropriate: allergies, current medications, past family history, past medical history, past social history, past surgical history and problem list. Last pap smear on 06/15/2020 was normal.  Review of Systems:  Pertinent items are noted in HPI.   Objective:  Physical Exam There were no vitals taken for this visit. Gen: NAD Abd: Soft, nontender and nondistended Pelvic: Normal appearing external genitalia; normal appearing vaginal mucosa and cervix.  IUD strings visualized, about 2.5 cm in length outside cervix.  Chaperone offered and declined.  Assessment & Plan:  Normal IUD check. Patient may keep IUD in place for up to 8 years. May remove sooner if she desires pregnancy, or has side effects within that time.   Annabella Shutter, DNP

## 2023-08-23 ENCOUNTER — Other Ambulatory Visit (HOSPITAL_COMMUNITY)
Admission: RE | Admit: 2023-08-23 | Discharge: 2023-08-23 | Disposition: A | Discharge status: Home / Self Care | Payer: Self-pay | Source: Ambulatory Visit | Attending: Nurse Practitioner | Admitting: Nurse Practitioner

## 2023-08-23 ENCOUNTER — Encounter: Payer: Self-pay | Admitting: Nurse Practitioner

## 2023-08-23 ENCOUNTER — Ambulatory Visit (INDEPENDENT_AMBULATORY_CARE_PROVIDER_SITE_OTHER): Payer: Self-pay | Admitting: Nurse Practitioner

## 2023-08-23 VITALS — BP 98/64 | HR 65 | Ht 63.0 in | Wt 147.0 lb

## 2023-08-23 DIAGNOSIS — Z833 Family history of diabetes mellitus: Secondary | ICD-10-CM

## 2023-08-23 DIAGNOSIS — Z01419 Encounter for gynecological examination (general) (routine) without abnormal findings: Secondary | ICD-10-CM

## 2023-08-23 DIAGNOSIS — Z30431 Encounter for routine checking of intrauterine contraceptive device: Secondary | ICD-10-CM

## 2023-08-23 DIAGNOSIS — Z124 Encounter for screening for malignant neoplasm of cervix: Secondary | ICD-10-CM

## 2023-08-23 DIAGNOSIS — Z1331 Encounter for screening for depression: Secondary | ICD-10-CM

## 2023-08-23 DIAGNOSIS — N393 Stress incontinence (female) (male): Secondary | ICD-10-CM

## 2023-08-23 DIAGNOSIS — E7800 Pure hypercholesterolemia, unspecified: Secondary | ICD-10-CM

## 2023-08-23 NOTE — Progress Notes (Signed)
 Jill Ramirez [birth year] [id number]   History:  38 y.o. G1P1001 presents for annual exam. Mirena 12/2022, occasional light spotting. Normal pap history. History of migraines without aura, good management with Maxalt .  Complains of worsening stress incontinence with laughing or jumping. Also gets lump in right axillary that comes and goes, sometimes has burning sensation, lasts 1-2 weeks. It happened after getting the Covid vaccines as well.   Gynecologic History No LMP recorded. (Menstrual status: IUD).   Contraception/Family planning: IUD Sexually active: Yes  Health Maintenance Last Pap: 06/15/2020. Results were: Normal, 3-year repeat Last mammogram: 12/2017. Results were: Normal Last colonoscopy: Not indicated Last Dexa: Not indicated     08/23/2023    9:58 AM  Depression screen PHQ 2/9  Decreased Interest 0  Down, Depressed, Hopeless 0  PHQ - 2 Score 0     Past medical history, past surgical history, family history and social history were all reviewed and documented in the EPIC chart. Married. Husband owns food truck. SAHM. 23 yo daughter - Teacher, English as a foreign language, going to [place] fall 2025.  ROS:  A ROS was performed and pertinent positives and negatives are included.  Exam:  Vitals:   08/23/23 1001  BP: 98/64  Pulse: 65  SpO2: 97%  Weight: 147 lb (66.7 kg)  Height: 5' 3 (1.6 m)      Body mass index is 26.04 kg/m.  General appearance:  Normal Thyroid:  Symmetrical, normal in size, without palpable masses or nodularity. Respiratory  Auscultation:  Clear without wheezing or rhonchi Cardiovascular  Auscultation:  Regular rate, without rubs, murmurs or gallops  Edema/varicosities:  Not grossly evident Abdominal  Soft,nontender, without masses, guarding or rebound.  Liver/spleen:  No organomegaly noted  Hernia:  None appreciated  Skin  Inspection:  Grossly normal Breasts: Examined lying and sitting.   Right: Without masses, retractions, nipple discharge or  axillary adenopathy.   Left: Without masses, retractions, nipple discharge or axillary adenopathy. Pelvic: External genitalia:  no lesions              Urethra:  normal appearing urethra with no masses, tenderness or lesions              Bartholins and Skenes: normal                 Vagina: normal appearing vagina with normal color and discharge, no lesions              Cervix: no lesions. + IUD strings Bimanual Exam:  Uterus:  no masses or tenderness              Adnexa: no mass, fullness, tenderness              Rectovaginal: Deferred              Anus:  normal, no lesions  Jill Ramirez, CMA present as chaperone.   Assessment/Plan:  38 y.o. G1P1001 for annual exam.   Well female exam with routine gynecological exam - Plan: Cytology - PAP( [name]), CBC with Differential/Platelet, Comprehensive metabolic panel with GFR. Education provided on SBEs, importance of preventative screenings, current guidelines, high calcium diet, regular exercise, and multivitamin daily.   Encounter for routine checking of intrauterine contraceptive device (IUD) - Mirena IUD 12/2022, occasional spotting. Aware of 8-year FDA approval.   Cervical cancer screening - Plan: Cytology - PAP( [name]). Normal Pap history.    Stress incontinence - Discussed causes and management options. H/O vaginal birth with significant  tear, chronic constipation. Educated on lifestyle changes, pelvic floor strengthening exercises. Will consider PFPT.   Intermittent axillary lump - likely reactive lymph node. None present today. Discussed when to be concerned.   Elevated LDL cholesterol level - Plan: Lipid panel  Family history of diabetes mellitus - Plan: Hemoglobin A1c  Return in about 1 year (around 08/22/2024) for Annual.    Jill DELENA Shutter DNP, 10:27 AM 08/23/2023

## 2023-08-24 LAB — COMPREHENSIVE METABOLIC PANEL WITH GFR
AG Ratio: 2.1 (calc) (ref 1.0–2.5)
ALT: 10 U/L (ref 6–29)
AST: 16 U/L (ref 10–30)
Albumin: 4.7 g/dL (ref 3.6–5.1)
Alkaline phosphatase (APISO): 54 U/L (ref 31–125)
BUN: 14 mg/dL (ref 7–25)
CO2: 24 mmol/L (ref 20–32)
Calcium: 9.1 mg/dL (ref 8.6–10.2)
Chloride: 104 mmol/L (ref 98–110)
Creat: 0.75 mg/dL (ref 0.50–0.97)
Globulin: 2.2 g/dL (ref 1.9–3.7)
Glucose, Bld: 84 mg/dL (ref 65–99)
Potassium: 4.6 mmol/L (ref 3.5–5.3)
Sodium: 138 mmol/L (ref 135–146)
Total Bilirubin: 0.4 mg/dL (ref 0.2–1.2)
Total Protein: 6.9 g/dL (ref 6.1–8.1)
eGFR: 104 mL/min/{1.73_m2} (ref >=60)

## 2023-08-24 LAB — CBC WITH DIFFERENTIAL/PLATELET
Absolute Lymphocytes: 1518 {cells}/uL (ref 850–3900)
Absolute Monocytes: 469 {cells}/uL (ref 200–950)
Basophils Absolute: 83 {cells}/uL (ref 0–200)
Basophils Relative: 1.2 %
Eosinophils Absolute: 469 {cells}/uL (ref 15–500)
Eosinophils Relative: 6.8 %
HCT: 40.5 % (ref 35.0–45.0)
Hemoglobin: 13.1 g/dL (ref 11.7–15.5)
MCH: 30 pg (ref 27.0–33.0)
MCHC: 32.3 g/dL (ref 32.0–36.0)
MCV: 92.9 fL (ref 80.0–100.0)
MPV: 9.8 fL (ref 7.5–12.5)
Monocytes Relative: 6.8 %
Neutro Abs: 4361 {cells}/uL (ref 1500–7800)
Neutrophils Relative %: 63.2 %
Platelets: 316 10*3/uL (ref 140–400)
RBC: 4.36 10*6/uL (ref 3.80–5.10)
RDW: 12.4 % (ref 11.0–15.0)
Total Lymphocyte: 22 %
WBC: 6.9 10*3/uL (ref 3.8–10.8)

## 2023-08-24 LAB — LIPID PANEL
Cholesterol: 187 mg/dL (ref <200)
HDL: 62 mg/dL (ref >=50)
LDL Cholesterol (Calc): 108 mg/dL — ABNORMAL HIGH
Non-HDL Cholesterol (Calc): 125 mg/dL (ref <130)
Total CHOL/HDL Ratio: 3 (calc) (ref <5.0)
Triglycerides: 79 mg/dL (ref <150)

## 2023-08-24 LAB — HEMOGLOBIN A1C
Hgb A1c MFr Bld: 5.2 % (ref <5.7)
Mean Plasma Glucose: 103 mg/dL
eAG (mmol/L): 5.7 mmol/L

## 2023-08-28 ENCOUNTER — Ambulatory Visit: Payer: Self-pay | Admitting: Nurse Practitioner

## 2023-08-28 LAB — CYTOLOGY - PAP
Comment: NEGATIVE
Diagnosis: NEGATIVE
Diagnosis: REACTIVE
High risk HPV: NEGATIVE
